# Patient Record
Sex: Female | Born: 1985 | Race: Black or African American | Hispanic: No | Marital: Single | Smoking: Never smoker
Health system: Southern US, Community
[De-identification: ages and names within clinical notes are randomized; demographics above are authoritative.]

## PROBLEM LIST (undated history)

## (undated) DIAGNOSIS — K649 Unspecified hemorrhoids: Secondary | ICD-10-CM

## (undated) DIAGNOSIS — Z8489 Family history of other specified conditions: Secondary | ICD-10-CM

## (undated) DIAGNOSIS — J40 Bronchitis, not specified as acute or chronic: Secondary | ICD-10-CM

## (undated) DIAGNOSIS — R87629 Unspecified abnormal cytological findings in specimens from vagina: Secondary | ICD-10-CM

## (undated) HISTORY — DX: Unspecified abnormal cytological findings in specimens from vagina: R87.629

## (undated) HISTORY — PX: NO PAST SURGERIES: SHX2092

---
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
## 2004-05-28 ENCOUNTER — Emergency Department (HOSPITAL_COMMUNITY): Admission: EM | Admit: 2004-05-28 | Discharge: 2004-05-28 | Payer: Self-pay | Admitting: Emergency Medicine

## 2004-07-13 ENCOUNTER — Inpatient Hospital Stay (HOSPITAL_COMMUNITY): Admission: AD | Admit: 2004-07-13 | Discharge: 2004-07-13 | Payer: Self-pay | Admitting: Obstetrics & Gynecology

## 2005-01-28 ENCOUNTER — Emergency Department (HOSPITAL_COMMUNITY): Admission: EM | Admit: 2005-01-28 | Discharge: 2005-01-28 | Payer: Self-pay | Admitting: Emergency Medicine

## 2005-07-01 ENCOUNTER — Emergency Department (HOSPITAL_COMMUNITY): Admission: EM | Admit: 2005-07-01 | Discharge: 2005-07-01 | Payer: Self-pay | Admitting: Family Medicine

## 2005-09-28 ENCOUNTER — Emergency Department (HOSPITAL_COMMUNITY): Admission: EM | Admit: 2005-09-28 | Discharge: 2005-09-28 | Payer: Self-pay | Admitting: Family Medicine

## 2005-12-03 ENCOUNTER — Emergency Department (HOSPITAL_COMMUNITY): Admission: EM | Admit: 2005-12-03 | Discharge: 2005-12-03 | Payer: Self-pay

## 2011-01-23 ENCOUNTER — Inpatient Hospital Stay (INDEPENDENT_AMBULATORY_CARE_PROVIDER_SITE_OTHER)
Admission: RE | Admit: 2011-01-23 | Discharge: 2011-01-23 | Disposition: A | Discharge status: Home / Self Care | Payer: Self-pay | Source: Ambulatory Visit | Attending: Emergency Medicine | Admitting: Emergency Medicine

## 2011-01-23 DIAGNOSIS — N898 Other specified noninflammatory disorders of vagina: Secondary | ICD-10-CM

## 2011-01-23 DIAGNOSIS — H109 Unspecified conjunctivitis: Secondary | ICD-10-CM

## 2011-01-23 LAB — WET PREP, GENITAL: Yeast Wet Prep HPF POC: NONE SEEN

## 2011-01-25 LAB — GC/CHLAMYDIA PROBE AMP, GENITAL
Chlamydia, DNA Probe: NEGATIVE
GC Probe Amp, Genital: NEGATIVE

## 2011-10-05 ENCOUNTER — Encounter (HOSPITAL_COMMUNITY): Payer: Self-pay | Admitting: Emergency Medicine

## 2011-10-05 ENCOUNTER — Emergency Department (HOSPITAL_COMMUNITY)
Admission: EM | Admit: 2011-10-05 | Discharge: 2011-10-05 | Disposition: A | Discharge status: Home / Self Care | Payer: Self-pay | Attending: Emergency Medicine | Admitting: Emergency Medicine

## 2011-10-05 ENCOUNTER — Emergency Department (HOSPITAL_COMMUNITY): Payer: Self-pay

## 2011-10-05 DIAGNOSIS — R0602 Shortness of breath: Secondary | ICD-10-CM | POA: Insufficient documentation

## 2011-10-05 DIAGNOSIS — R062 Wheezing: Secondary | ICD-10-CM | POA: Insufficient documentation

## 2011-10-05 DIAGNOSIS — J3489 Other specified disorders of nose and nasal sinuses: Secondary | ICD-10-CM | POA: Insufficient documentation

## 2011-10-05 DIAGNOSIS — J40 Bronchitis, not specified as acute or chronic: Secondary | ICD-10-CM | POA: Insufficient documentation

## 2011-10-05 HISTORY — DX: Bronchitis, not specified as acute or chronic: J40

## 2011-10-05 MED ORDER — ALBUTEROL SULFATE (5 MG/ML) 0.5% IN NEBU
2.5000 mg | INHALATION_SOLUTION | Freq: Once | RESPIRATORY_TRACT | Status: AC
  Administered 2011-10-05: 5 mg via RESPIRATORY_TRACT
  Filled 2011-10-05: qty 1

## 2011-10-05 MED ORDER — AEROCHAMBER PLUS W/MASK MISC
Status: AC
  Filled 2011-10-05: qty 1

## 2011-10-05 MED ORDER — ALBUTEROL SULFATE HFA 108 (90 BASE) MCG/ACT IN AERS
2.0000 | INHALATION_SPRAY | RESPIRATORY_TRACT | Status: DC | PRN
  Filled 2011-10-05: qty 6.7

## 2011-10-05 MED ORDER — ALBUTEROL SULFATE (5 MG/ML) 0.5% IN NEBU
5.0000 mg | INHALATION_SOLUTION | Freq: Once | RESPIRATORY_TRACT | Status: AC
  Administered 2011-10-05: 5 mg via RESPIRATORY_TRACT
  Filled 2011-10-05: qty 1

## 2011-10-05 MED ORDER — IPRATROPIUM BROMIDE 0.02 % IN SOLN
0.5000 mg | Freq: Once | RESPIRATORY_TRACT | Status: AC
  Administered 2011-10-05: 0.5 mg via RESPIRATORY_TRACT
  Filled 2011-10-05: qty 2.5

## 2011-10-05 MED ORDER — AEROCHAMBER MAX W/MASK MEDIUM MISC
1.0000 | Freq: Once | Status: AC
  Administered 2011-10-05: 1
  Filled 2011-10-05: qty 1

## 2011-10-05 NOTE — ED Provider Notes (Signed)
History     CSN: [id number]  Arrival date & time 10/05/11  4098   First MD Initiated Contact with Patient 10/05/11 0719      Chief Complaint  Patient presents with  . Shortness of Breath    (Consider location/radiation/quality/duration/timing/severity/associated sxs/prior treatment) HPI Comments: Patient awoke this morning with onset of cough and wheezing. Cough was productive of sputum. She denies fever. She has runny nose, no sore throat. No chest pain or abdominal pain. No history of asthma. No treatments prior to arrival.  Patient is a 26 y.o. female presenting with cough. The history is provided by the patient.  Cough This is a new problem. The current episode started 3 to 5 hours ago. The problem has not changed since onset.The cough is productive of sputum. There has been no fever. Associated symptoms include rhinorrhea and wheezing. Pertinent negatives include no chest pain, no chills, no ear pain, no headaches, no sore throat, no myalgias, no shortness of breath and no eye redness. She has tried nothing for the symptoms. Her past medical history is significant for bronchitis. Her past medical history does not include asthma.    Past Medical History  Diagnosis Date  . Bronchitis     History reviewed. No pertinent past surgical history.  No family history on file.  History  Substance Use Topics  . Smoking status: Never Smoker   . Smokeless tobacco: Not on file  . Alcohol Use: Yes    OB History    Grav Para Term Preterm Abortions TAB SAB Ect Mult Living                  Review of Systems  Constitutional: Negative for fever and chills.  HENT: Positive for rhinorrhea. Negative for ear pain and sore throat.   Eyes: Negative for redness.  Respiratory: Positive for cough and wheezing. Negative for shortness of breath.   Cardiovascular: Negative for chest pain and leg swelling.  Gastrointestinal: Negative for nausea, vomiting, abdominal pain and diarrhea.    Genitourinary: Negative for dysuria.  Musculoskeletal: Negative for myalgias.  Skin: Negative for rash.  Neurological: Negative for headaches.    Allergies  Review of patient's allergies indicates no known allergies.  Home Medications  No current outpatient prescriptions on file.  BP 126/86  Pulse 90  Temp(Src) 98.1 F (36.7 C) (Oral)  Resp 18  SpO2 94%  LMP 09/20/2011  Physical Exam  Nursing note and vitals reviewed. Constitutional: She is oriented to person, place, and time. She appears well-developed and well-nourished.  HENT:  Head: Normocephalic and atraumatic.  Right Ear: Tympanic membrane, external ear and ear canal normal.  Left Ear: Tympanic membrane, external ear and ear canal normal.  Nose: Rhinorrhea present.  Mouth/Throat: Uvula is midline and oropharynx is clear and moist. Mucous membranes are not dry.  Eyes: Conjunctivae are normal. Right eye exhibits no discharge. Left eye exhibits no discharge.  Neck: Normal range of motion. Neck supple.  Cardiovascular: Normal rate, regular rhythm and normal heart sounds.   Pulmonary/Chest: Effort normal. She has no decreased breath sounds. She has wheezes in the right lower field and the left lower field. She has rhonchi (throughout). She has no rales.  Abdominal: Soft. There is no tenderness.  Neurological: She is alert and oriented to person, place, and time.  Skin: Skin is warm and dry.  Psychiatric: She has a normal mood and affect.    ED Course  Procedures (including critical care time)  Labs Reviewed - No data  to display Dg Chest 2 View  10/05/2011  *RADIOLOGY REPORT*  Clinical Data: Shortness of breath.  Cough.  CHEST - 2 VIEW  Comparison: None.  Findings: The heart size is normal.  The lungs are clear.  The visualized soft tissues and bony thorax are unremarkable.  IMPRESSION: Negative chest.  Original Report Authenticated By: Jamesetta Orleans. MATTERN, M.D.     1. Bronchitis     7:38 AM Patient seen and  examined. Work-up initiated. Medications ordered. Felt better initially after breathing treatment, now chest is tight again.   Vital signs reviewed and are as follows: Filed Vitals:   10/05/11 0534  BP: 126/86  Pulse: 90  Temp: 98.1 F (36.7 C)  Resp: 18   X-ray was reviewed by myself and was negative. Patient informed of results. She is resting comfortably. Breathing improved after second breathing treatment. Exam unchanged. She appears well.   Patient counseled on use of albuterol HFA.  Told to use 1-2 puffs q 4 hours as needed for SOB.  MDM  Patient with cough, no fever. Her chest x-ray is negative. She is improved in emergency department after 2 breathing treatments. Will treat as bronchitis. Patient appears well and is stable at time of discharge.        Renne Crigler, [place] 10/05/11 1007

## 2011-10-05 NOTE — ED Provider Notes (Signed)
Medical screening examination/treatment/procedure(s) were performed by non-physician practitioner and as supervising physician I was immediately available for consultation/collaboration.  Doug Sou, MD 10/05/11 [phone]

## 2011-10-05 NOTE — ED Notes (Signed)
PT. REPORTS SOB WITH PRODUCTIVE COUGH AND WHEEZING ONSET THIS MORNING .

## 2011-10-05 NOTE — Discharge Instructions (Signed)
Please read and follow all provided instructions.  Your diagnoses today include:  1. Bronchitis    Tests performed today include:  Chest x-ray - does not show any pneumonia  Vital signs. See below for your results today.   Medications prescribed:    Albuterol inhaler - medication that opens up your airway  Use inhaler as follows: 1-2 puffs with spacer every 4 hours as needed for wheezing, cough, or shortness of breath.   Take any prescribed medications only as directed.  Home care instructions:  Follow any educational materials contained in this packet.  Follow-up instructions: Please follow-up with your primary care provider in the next 3 days for further evaluation of your symptoms and a recheck if you are not feeling better. If you do not have a primary care doctor -- see below for referral information.   Return instructions:   Please return to the Emergency Department if you experience worsening symptoms.  Please return with worsening wheezing, shortness of breath, or difficulty breathing.  Return with persistent fever above 101F.   Please return if you have any other emergent concerns.  Additional Information:  Your vital signs today were: BP 126/86  Pulse 90  Temp(Src) 98.1 F (36.7 C) (Oral)  Resp 18  SpO2 94%  LMP 09/20/2011 If your blood pressure (BP) was elevated above 135/85 this visit, please have this repeated by your doctor within one month. -------------- No Primary Care Doctor Call [facility]  [phone] Other agencies that provide inexpensive medical care    [facility]  [phone]    [facility]  [phone]    [facility]  [phone]    [facility]  [phone]    [facility]  [phone]    [facility]  [phone] -------------- RESOURCE GUIDE:  Dental Problems  Patients with Medicaid: [facility] [phone] [address]                                             [phone] [address] Phone:  [phone]                                                   Phone:  [phone]  If unable to pay or uninsured, contact:  [facility] or [facility]. to become qualified for the adult dental clinic.  Chronic Pain Problems Contact [facility]  [phone] Patients need to be referred by their primary care doctor.  Insufficient Money for Medicine Contact [employer] Way:  call "211" or [facility] [phone].  Psychological Services [facility] Behavior[facility]  [phone] [facility]  [phone] [facility]   [phone] (emergency services [phone])  Substance Abuse Resources [facility]  [phone] [facility] (250)524-8516 [facility] [place] [phone] Floydene Flock [phone] Residential & Outpatient Substance Abuse Program  [phone]  Abuse/Neglect [facility] Child Abuse Hotline (302)422-6432 [facility] Child Abuse Hotline (248)248-1128 (After Hours)  Emergency Shelter [facility] [phone]  Maternity Homes Room at the [place] [facility] [phone])  [phone] [employer] Services [phone]  [facility] Resources  [facility]     [employer]                          [facility] Dept. [address] [place]                       [address]      [address]  Blondell Reveal Phone:  [phone]                                   Phone:  [phone]                 Phone:  [phone]  [facility] Mental [facility] Phone:  [phone]  [facility] Child Abuse Hotline 480 291 [phone] (After Hours)

## 2012-04-16 ENCOUNTER — Emergency Department (INDEPENDENT_AMBULATORY_CARE_PROVIDER_SITE_OTHER)
Admission: EM | Admit: 2012-04-16 | Discharge: 2012-04-16 | Disposition: A | Discharge status: Home / Self Care | Payer: Self-pay | Source: Home / Self Care | Attending: Family Medicine | Admitting: Family Medicine

## 2012-04-16 ENCOUNTER — Encounter (HOSPITAL_COMMUNITY): Payer: Self-pay | Admitting: Emergency Medicine

## 2012-04-16 DIAGNOSIS — B9689 Other specified bacterial agents as the cause of diseases classified elsewhere: Secondary | ICD-10-CM

## 2012-04-16 DIAGNOSIS — N760 Acute vaginitis: Secondary | ICD-10-CM

## 2012-04-16 DIAGNOSIS — A499 Bacterial infection, unspecified: Secondary | ICD-10-CM

## 2012-04-16 LAB — POCT PREGNANCY, URINE: Preg Test, Ur: NEGATIVE

## 2012-04-16 LAB — POCT URINALYSIS DIP (DEVICE)
Glucose, UA: NEGATIVE mg/dL
Hgb urine dipstick: NEGATIVE
Ketones, ur: NEGATIVE mg/dL
Protein, ur: 30 mg/dL — AB
Specific Gravity, Urine: 1.015 (ref 1.005–1.030)
Urobilinogen, UA: >=8 mg/dL (ref 0.0–1.0)

## 2012-04-16 LAB — WET PREP, GENITAL
Trich, Wet Prep: NONE SEEN
Yeast Wet Prep HPF POC: NONE SEEN

## 2012-04-16 MED ORDER — FLUCONAZOLE 150 MG PO TABS: 150.0000 mg | ORAL_TABLET | Freq: Once | ORAL | Status: DC

## 2012-04-16 MED ORDER — METRONIDAZOLE 250 MG PO TABS: 250.0000 mg | ORAL_TABLET | Freq: Three times a day (TID) | ORAL | Status: DC

## 2012-04-16 NOTE — ED Provider Notes (Signed)
History     CSN: [id number]  Arrival date & time 04/16/12  1759   First MD Initiated Contact with Patient 04/16/12 1811      Chief Complaint  Patient presents with  . Vaginal Itching    (Consider location/radiation/quality/duration/timing/severity/associated sxs/prior treatment) Patient is a 26 y.o. female presenting with vaginal discharge. The history is provided by the patient.  Vaginal Discharge This is a new problem. The current episode started more than 1 week ago. The problem has been gradually worsening. Pertinent negatives include no abdominal pain. Treatments tried: otc bv med but no relief.    Past Medical History  Diagnosis Date  . Bronchitis     History reviewed. No pertinent past surgical history.  History reviewed. No pertinent family history.  History  Substance Use Topics  . Smoking status: Never Smoker   . Smokeless tobacco: Not on file  . Alcohol Use: Yes    OB History    Grav Para Term Preterm Abortions TAB SAB Ect Mult Living                  Review of Systems  Constitutional: Negative.   Gastrointestinal: Negative.  Negative for abdominal pain.  Genitourinary: Positive for vaginal discharge. Negative for dysuria, vaginal bleeding, vaginal pain, menstrual problem and pelvic pain.    Allergies  Review of patient's allergies indicates no known allergies.  Home Medications   Current Outpatient Rx  Name Route Sig Dispense Refill  . FLUCONAZOLE 150 MG PO TABS Oral Take 1 tablet (150 mg total) by mouth once. 1 tablet 0  . METRONIDAZOLE 250 MG PO TABS Oral Take 1 tablet (250 mg total) by mouth 3 (three) times daily. 21 tablet 0    BP 124/74  Pulse 72  Temp 98.3 F (36.8 C) (Oral)  Resp 14  SpO2 100%  LMP 03/22/2012  Physical Exam  Nursing note and vitals reviewed. Constitutional: She is oriented to person, place, and time. She appears well-developed and well-nourished.  Abdominal: Soft. Bowel sounds are normal. She exhibits no  distension and no mass. There is no tenderness. There is no rebound and no guarding.  Genitourinary: Uterus normal. There is no rash or tenderness on the right labia. There is no rash or tenderness on the left labia. There is erythema around the vagina. No tenderness or bleeding around the vagina. Vaginal discharge found.  Neurological: She is alert and oriented to person, place, and time.  Skin: Skin is warm and dry.    ED Course  Procedures (including critical care time)  Labs Reviewed  POCT URINALYSIS DIP (DEVICE) - Abnormal; Notable for the following:    Bilirubin Urine SMALL (*)     Protein, ur 30 (*)     All other components within normal limits  POCT PREGNANCY, URINE  GC/CHLAMYDIA PROBE AMP, GENITAL  WET PREP, GENITAL   No results found.   1. Bacterial vaginitis       MDM          Linna Hoff, MD 04/16/12 1927

## 2012-04-16 NOTE — ED Notes (Signed)
Pt c/o vaginal odor and irritation x 1 wk with a thin white discharge. Pt states that she used over the counter bv essentials with no relief of symptoms, it seemed to make it worse.  Pt denies pelvic pain but is experiencing some lower back pain on the left side only.

## 2012-04-17 LAB — GC/CHLAMYDIA PROBE AMP, GENITAL: GC Probe Amp, Genital: NEGATIVE

## 2012-07-31 ENCOUNTER — Emergency Department (INDEPENDENT_AMBULATORY_CARE_PROVIDER_SITE_OTHER)
Admission: EM | Admit: 2012-07-31 | Discharge: 2012-07-31 | Disposition: A | Discharge status: Home / Self Care | Payer: Self-pay | Source: Home / Self Care

## 2012-07-31 ENCOUNTER — Encounter (HOSPITAL_COMMUNITY): Payer: Self-pay | Admitting: Emergency Medicine

## 2012-07-31 ENCOUNTER — Other Ambulatory Visit (HOSPITAL_COMMUNITY)
Admission: RE | Admit: 2012-07-31 | Discharge: 2012-07-31 | Disposition: A | Discharge status: Home / Self Care | Payer: Self-pay | Source: Ambulatory Visit | Attending: Emergency Medicine | Admitting: Emergency Medicine

## 2012-07-31 DIAGNOSIS — B9689 Other specified bacterial agents as the cause of diseases classified elsewhere: Secondary | ICD-10-CM

## 2012-07-31 DIAGNOSIS — N760 Acute vaginitis: Secondary | ICD-10-CM | POA: Insufficient documentation

## 2012-07-31 DIAGNOSIS — Z113 Encounter for screening for infections with a predominantly sexual mode of transmission: Secondary | ICD-10-CM | POA: Insufficient documentation

## 2012-07-31 DIAGNOSIS — A499 Bacterial infection, unspecified: Secondary | ICD-10-CM

## 2012-07-31 LAB — POCT URINALYSIS DIP (DEVICE)
Bilirubin Urine: NEGATIVE
Hgb urine dipstick: NEGATIVE
Leukocytes, UA: NEGATIVE
Nitrite: NEGATIVE
Urobilinogen, UA: 2 mg/dL — ABNORMAL HIGH (ref 0.0–1.0)
pH: 7 (ref 5.0–8.0)

## 2012-07-31 MED ORDER — METRONIDAZOLE 500 MG PO TABS: 500.0000 mg | ORAL_TABLET | Freq: Two times a day (BID) | ORAL | Status: DC

## 2012-07-31 NOTE — ED Provider Notes (Signed)
Medical screening examination/treatment/procedure(s) were performed by resident physician or non-physician practitioner and as supervising physician I was immediately available for consultation/collaboration.   Barkley Bruns MD.    Linna Hoff, MD 07/31/12 [phone]

## 2012-07-31 NOTE — ED Notes (Signed)
Pt is here for vag discharge x3 days Sx include: white discharge, foul odor, lower back pain Denies: dysuria, abd pain, f/v/n/d LMP: 07/31/12 Back pain: 3/10 on pain scale Hx of BV  She is alert w/no signs of acute distress

## 2012-07-31 NOTE — ED Provider Notes (Signed)
History     CSN: [id number]  Arrival date & time 07/31/12  1314   None     Chief Complaint  Patient presents with  . Vaginal Discharge    (Consider location/radiation/quality/duration/timing/severity/associated sxs/prior treatment) HPI Comments: 27 year old female presents with a painless vaginal discharge. She states it has a bad odor. She denies pelvic or abdominal pain denies fever, nausea, vomiting or urinary symptoms. She states she recently started her menses.   Past Medical History  Diagnosis Date  . Bronchitis     History reviewed. No pertinent past surgical history.  No family history on file.  History  Substance Use Topics  . Smoking status: Never Smoker   . Smokeless tobacco: Not on file  . Alcohol Use: Yes    OB History    Grav Para Term Preterm Abortions TAB SAB Ect Mult Living                  Review of Systems  Constitutional: Negative.   Respiratory: Negative.   Cardiovascular: Negative.   Gastrointestinal: Negative.   Genitourinary:       As per history of present illness  Neurological: Negative.   Psychiatric/Behavioral: Negative.     Allergies  Review of patient's allergies indicates no known allergies.  Home Medications   Current Outpatient Rx  Name  Route  Sig  Dispense  Refill  . FLUCONAZOLE 150 MG PO TABS   Oral   Take 1 tablet (150 mg total) by mouth once.   1 tablet   0   . METRONIDAZOLE 500 MG PO TABS   Oral   Take 1 tablet (500 mg total) by mouth 2 (two) times daily. X 7 days   14 tablet   0     BP 127/80  Pulse 74  Temp 98.4 F (36.9 C) (Oral)  Resp 16  SpO2 100%  LMP 07/31/2012  Physical Exam  Nursing note and vitals reviewed. Constitutional: She is oriented to person, place, and time. She appears well-developed and well-nourished. No distress.  HENT:  Head: Normocephalic and atraumatic.  Mouth/Throat: No oropharyngeal exudate.  Eyes: Conjunctivae normal and EOM are normal.  Neck: Normal range of  motion. Neck supple.  Cardiovascular: Normal rate and normal heart sounds.   Pulmonary/Chest: Effort normal and breath sounds normal. No respiratory distress.  Abdominal: Soft. She exhibits no distension and no mass. There is no tenderness. There is no rebound and no guarding.  Genitourinary:       External pelvic palpation is negative for tenderness. Normal external female genitalia Cervix is midline and posterior. The ectocervix is smooth and pink without erythema or lesions. And there is scant amount of both thick mucoid secretions and pale green , thin discharge. Bimanual: Negative for CMT or adnexal tenderness.  Musculoskeletal: Normal range of motion.  Neurological: She is alert and oriented to person, place, and time. No cranial nerve deficit.  Skin: Skin is warm and dry.  Psychiatric: She has a normal mood and affect.    ED Course  Procedures (including critical care time)  Labs Reviewed  POCT URINALYSIS DIP (DEVICE) - Abnormal; Notable for the following:    Urobilinogen, UA 2.0 (*)     All other components within normal limits  POCT PREGNANCY, URINE  CERVICOVAGINAL ANCILLARY ONLY   No results found.   1. BV (bacterial vaginosis)   2. Vaginitis       MDM  Patient with painless vaginal discharge consistent with bacterial vaginosis. Discharged in stable condition  Metronidazole 500 mg twice a day for 7 days Standard swab testing was performed. Results are pending, upon return for any positives will call and treat accordingly.            Hayden Rasmussen, NP 07/31/12 1507

## 2012-10-23 ENCOUNTER — Encounter (HOSPITAL_COMMUNITY): Payer: Self-pay | Admitting: *Deleted

## 2012-10-23 ENCOUNTER — Emergency Department (INDEPENDENT_AMBULATORY_CARE_PROVIDER_SITE_OTHER)
Admission: EM | Admit: 2012-10-23 | Discharge: 2012-10-23 | Disposition: A | Discharge status: Home / Self Care | Payer: Self-pay | Source: Home / Self Care | Attending: Emergency Medicine | Admitting: Emergency Medicine

## 2012-10-23 DIAGNOSIS — L02412 Cutaneous abscess of left axilla: Secondary | ICD-10-CM

## 2012-10-23 DIAGNOSIS — IMO0002 Reserved for concepts with insufficient information to code with codable children: Secondary | ICD-10-CM

## 2012-10-23 MED ORDER — IBUPROFEN 400 MG PO TABS: 400.0000 mg | ORAL_TABLET | Freq: Four times a day (QID) | ORAL | Status: DC | PRN

## 2012-10-23 MED ORDER — DOXYCYCLINE HYCLATE 100 MG PO CAPS: 100.0000 mg | ORAL_CAPSULE | Freq: Two times a day (BID) | ORAL | Status: AC

## 2012-10-23 NOTE — ED Notes (Signed)
C/o boil under L axilla onset 3 days ago.

## 2012-10-23 NOTE — ED Provider Notes (Signed)
History     CSN: [id number]  Arrival date & time 10/23/12  1847   First MD Initiated Contact with Patient 10/23/12 1852      Chief Complaint  Patient presents with  . Abscess    (Consider location/radiation/quality/duration/timing/severity/associated sxs/prior treatment) HPI Comments: Presents this evening complaining of pain swelling and a ball formation in her left axillary region. Denies any trauma or injury. Denies any fevers or chills. She didn't attempt to squeeze it but was unsuccessful  Patient is a 27 y.o. female presenting with abscess. The history is provided by the patient.  Abscess Abscess quality: induration, painful, redness and warmth   Red streaking: no   Duration:  3 days Pain details:    Quality:  Pressure and sharp   Severity:  Mild   Timing:  Constant   Progression:  Worsening Context: not immunosuppression and not skin injury   Relieved by:  Nothing Associated symptoms: no anorexia, no fatigue and no fever   Risk factors: no prior abscess     Past Medical History  Diagnosis Date  . Bronchitis     History reviewed. No pertinent past surgical history.  Family History  Problem Relation Age of Onset  . Hypertension Other   . Diabetes Other   . Asthma Other     History  Substance Use Topics  . Smoking status: Never Smoker   . Smokeless tobacco: Not on file  . Alcohol Use: Yes     Comment: occasional    OB History   Grav Para Term Preterm Abortions TAB SAB Ect Mult Living                  Review of Systems  Constitutional: Positive for activity change. Negative for fever, chills, appetite change and fatigue.  Gastrointestinal: Negative for anorexia.  Musculoskeletal: Negative for joint swelling and arthralgias.  Skin: Positive for color change and rash.    Allergies  Review of patient's allergies indicates no known allergies.  Home Medications   Current Outpatient Rx  Name  Route  Sig  Dispense  Refill  . doxycycline  (VIBRAMYCIN) 100 MG capsule   Oral   Take 1 capsule (100 mg total) by mouth 2 (two) times daily.   20 capsule   0   . ibuprofen (ADVIL,MOTRIN) 400 MG tablet   Oral   Take 1 tablet (400 mg total) by mouth every 6 (six) hours as needed for pain.   30 tablet   0     BP 126/78  Pulse 59  Temp(Src) 99.2 F (37.3 C) (Oral)  Resp 18  SpO2 100%  LMP 10/21/2012  Physical Exam  Nursing note and vitals reviewed. Constitutional: Vital signs are normal. She appears well-developed and well-nourished.  Neck: Neck supple.  Musculoskeletal:       Arms: Neurological: She is alert.  Skin: No rash noted. There is erythema.    ED Course  INCISION AND DRAINAGE Date/Time: 10/23/2012 8:04 PM Performed by: Rebel Willcutt Authorized by: Jimmie Molly Consent: Verbal consent obtained. Risks and benefits: risks, benefits and alternatives were discussed Consent given by: patient Patient understanding: patient states understanding of the procedure being performed Patient identity confirmed: verbally with patient Type: abscess Anesthesia: local infiltration Scalpel size: 11 Incision type: elliptical Complexity: simple Packing material: 1/4 in gauze   (including critical care time)  Labs Reviewed  CULTURE, ROUTINE-ABSCESS   No results found.   1. Abscess of left axilla       MDM  Left  axillary abscess effectively I indeed appear patient was started on doxycycline and asked to return for abscess recheck and drainage removal. Uncomplicated procedure      Jimmie Molly, MD 10/23/12 2006

## 2012-10-25 ENCOUNTER — Emergency Department (INDEPENDENT_AMBULATORY_CARE_PROVIDER_SITE_OTHER)
Admission: EM | Admit: 2012-10-25 | Discharge: 2012-10-25 | Disposition: A | Discharge status: Home / Self Care | Payer: Self-pay | Source: Home / Self Care | Attending: Family Medicine | Admitting: Family Medicine

## 2012-10-25 ENCOUNTER — Encounter (HOSPITAL_COMMUNITY): Payer: Self-pay

## 2012-10-25 DIAGNOSIS — Z4800 Encounter for change or removal of nonsurgical wound dressing: Secondary | ICD-10-CM

## 2012-10-25 NOTE — ED Notes (Signed)
Here for wound check and poss packing removal

## 2012-10-25 NOTE — ED Provider Notes (Signed)
History     CSN: [id number]  Arrival date & time 10/25/12  6295   First MD Initiated Contact with Patient 10/25/12 1859      Chief Complaint  Patient presents with  . Wound Check    (Consider location/radiation/quality/duration/timing/severity/associated sxs/prior treatment) HPI Comments: 27 year old nondiabetic female with a history of left axillary abscess status post incision and drainage today's ago. Denies fever or chills. Reports pain, swelling and redness decrease. No longer draining. She's here for wound check and packing removal. Patient has staph growing in her abscess culture still not sensitivity. She's taking doxycycline without side effects.     Past Medical History  Diagnosis Date  . Bronchitis     History reviewed. No pertinent past surgical history.  Family History  Problem Relation Age of Onset  . Hypertension Mother   . Diabetes Mother   . Asthma Brother     History  Substance Use Topics  . Smoking status: Never Smoker   . Smokeless tobacco: Not on file  . Alcohol Use: Yes     Comment: occasional    OB History   Grav Para Term Preterm Abortions TAB SAB Ect Mult Living                  Review of Systems  Constitutional: Negative for fever and chills.  Gastrointestinal: Negative for nausea and vomiting.  Skin: Positive for wound.       As per history of present illness  All other systems reviewed and are negative.    Allergies  Review of patient's allergies indicates no known allergies.  Home Medications   Current Outpatient Rx  Name  Route  Sig  Dispense  Refill  . doxycycline (VIBRAMYCIN) 100 MG capsule   Oral   Take 1 capsule (100 mg total) by mouth 2 (two) times daily.   20 capsule   0   . ibuprofen (ADVIL,MOTRIN) 400 MG tablet   Oral   Take 1 tablet (400 mg total) by mouth every 6 (six) hours as needed for pain.   30 tablet   0     BP 130/77  Pulse 60  Temp(Src) 98.4 F (36.9 C) (Oral)  Resp 16  SpO2 99%  LMP  10/21/2012  Physical Exam  Vitals reviewed. Constitutional: She is oriented to person, place, and time. She appears well-developed and well-nourished.  Neurological: She is alert and oriented to person, place, and time.  Skin:  Left axilla: Abscess status post incision and drainage with packing. Packing was removed only pink not significant drainage after packing removal. No significant erythema, no induration or fluctuation. Area was not tender.    ED Course  Procedures (including critical care time)  Labs Reviewed - No data to display No results found.   1. Abscess packing removal       MDM  Packing removed. Appears healing well. Continue doxycycline. Wound care instructions provided. Supportive care and red flags that should prompt her return to medical attention discussed with patient and provided in writing.         Sharin Grave, MD 10/25/12 2841

## 2012-10-26 LAB — CULTURE, ROUTINE-ABSCESS

## 2012-10-27 ENCOUNTER — Telehealth (HOSPITAL_COMMUNITY): Payer: Self-pay | Admitting: *Deleted

## 2012-10-27 NOTE — ED Notes (Signed)
Abscess culture L axilla:  Abundant MRSA.  Pt. adequately treated with Doxycycline.  I called pt. But she was unavailable. Unable to leave a message. I called pt.'s Mother and she said she is on the highway coming home. I told her to call me tomorrow between 2-3 P @ [phone]. Vassie Moselle 10/27/2012

## 2012-10-29 ENCOUNTER — Telehealth (HOSPITAL_COMMUNITY): Payer: Self-pay | Admitting: *Deleted

## 2012-10-29 ENCOUNTER — Telehealth (HOSPITAL_COMMUNITY): Payer: Self-pay | Admitting: Family Medicine

## 2012-10-29 NOTE — ED Notes (Signed)
Unable to reach pt. by phone. I called pt.'s contact [name] and left a message for her to call.  Mother said she would give her the message. Cynthia Wong 10/29/2012

## 2012-10-29 NOTE — ED Notes (Signed)
Patient returned lab result phone call today;  Patient made aware of positive MRSA results and she was treated adequately with Doxycyline.  Patient given MRSA specific instructions and had all additional questions answered

## 2012-10-31 ENCOUNTER — Telehealth (HOSPITAL_COMMUNITY): Payer: Self-pay | Admitting: *Deleted

## 2012-10-31 NOTE — ED Notes (Signed)
I called pt. back to make sure she understood the MRSA instructions. Pt.'s questions answered.

## 2012-11-15 ENCOUNTER — Emergency Department (INDEPENDENT_AMBULATORY_CARE_PROVIDER_SITE_OTHER)
Admission: EM | Admit: 2012-11-15 | Discharge: 2012-11-15 | Disposition: A | Discharge status: Home / Self Care | Payer: Self-pay | Source: Home / Self Care | Attending: Family Medicine | Admitting: Family Medicine

## 2012-11-15 ENCOUNTER — Encounter (HOSPITAL_COMMUNITY): Payer: Self-pay | Admitting: Emergency Medicine

## 2012-11-15 DIAGNOSIS — B9689 Other specified bacterial agents as the cause of diseases classified elsewhere: Secondary | ICD-10-CM

## 2012-11-15 DIAGNOSIS — A499 Bacterial infection, unspecified: Secondary | ICD-10-CM

## 2012-11-15 DIAGNOSIS — N760 Acute vaginitis: Secondary | ICD-10-CM

## 2012-11-15 LAB — POCT URINALYSIS DIP (DEVICE)
Bilirubin Urine: NEGATIVE
Glucose, UA: NEGATIVE mg/dL
Ketones, ur: NEGATIVE mg/dL
Leukocytes, UA: NEGATIVE
Protein, ur: 30 mg/dL — AB
Specific Gravity, Urine: 1.02 (ref 1.005–1.030)

## 2012-11-15 LAB — POCT PREGNANCY, URINE: Preg Test, Ur: NEGATIVE

## 2012-11-15 MED ORDER — METRONIDAZOLE 500 MG PO TABS: 500.0000 mg | ORAL_TABLET | Freq: Two times a day (BID) | ORAL | Status: DC

## 2012-11-15 MED ORDER — FLUCONAZOLE 150 MG PO TABS: ORAL_TABLET | ORAL | Status: DC

## 2012-11-15 NOTE — ED Provider Notes (Signed)
History     CSN: [id number]  Arrival date & time 11/15/12  1707   First MD Initiated Contact with Patient 11/15/12 1847      Chief Complaint  Patient presents with  . Vaginal Itching    (Consider location/radiation/quality/duration/timing/severity/associated sxs/prior treatment) HPI Comments: 27 year old female with history of recurrent BV. Here complaining of vaginal itchiness and minimal discharge with foul smell. Patient was treated with antibiotics recently for a skin infection. Denies significant vaginal discharge denies pelvic pain patient is not concern about sexually transmitted diseases. Denies abdominal pain, nausea or vomiting. No fever or chills. No dysuria.   Past Medical History  Diagnosis Date  . Bronchitis     History reviewed. No pertinent past surgical history.  Family History  Problem Relation Age of Onset  . Hypertension Mother   . Diabetes Mother   . Asthma Brother     History  Substance Use Topics  . Smoking status: Never Smoker   . Smokeless tobacco: Not on file  . Alcohol Use: Yes     Comment: occasional    OB History   Grav Para Term Preterm Abortions TAB SAB Ect Mult Living                  Review of Systems  Constitutional: Negative for fever, chills and appetite change.  Gastrointestinal: Negative for nausea, vomiting and abdominal pain.  Genitourinary: Negative for dysuria, frequency, hematuria, flank pain, vaginal bleeding and pelvic pain.  Skin: Negative for rash.  Neurological: Negative for dizziness and headaches.  All other systems reviewed and are negative.    Allergies  Review of patient's allergies indicates no known allergies.  Home Medications   Current Outpatient Rx  Name  Route  Sig  Dispense  Refill  . fluconazole (DIFLUCAN) 150 MG tablet      1 tab po every 72 h x2   2 tablet   0   . ibuprofen (ADVIL,MOTRIN) 400 MG tablet   Oral   Take 1 tablet (400 mg total) by mouth every 6 (six) hours as needed for  pain.   30 tablet   0   . metroNIDAZOLE (FLAGYL) 500 MG tablet   Oral   Take 1 tablet (500 mg total) by mouth 2 (two) times daily.   14 tablet   0     BP 119/74  Pulse 75  Temp(Src) 98.4 F (36.9 C) (Oral)  Resp 16  SpO2 100%  LMP 10/21/2012  Physical Exam  Nursing note and vitals reviewed. Constitutional: She is oriented to person, place, and time. She appears well-developed and well-nourished. No distress.  HENT:  Head: Normocephalic and atraumatic.  Eyes: No scleral icterus.  Cardiovascular: Normal heart sounds.   Pulmonary/Chest: Breath sounds normal.  Abdominal: Soft. She exhibits no distension and no mass. There is no tenderness. There is no rebound and no guarding.  No CVT  Genitourinary:  Pelvic exam deferred  Neurological: She is alert and oriented to person, place, and time.  Skin: No rash noted. She is not diaphoretic.    ED Course  Procedures (including critical care time)  Labs Reviewed  POCT URINALYSIS DIP (DEVICE) - Abnormal; Notable for the following:    Protein, ur 30 (*)    All other components within normal limits  POCT PREGNANCY, URINE   No results found.   1. Vaginitis       MDM  Not associated pelvic pain. Declined STD check. Last STD check in January normal; impress possible candida  related vaginitis. Has risk factor of recent abx. Tx. u-preg negative.  Treated empirically with Flagyl and Diflucan. Supportive care and red flags that should prompt her return to medical attention discussed with patient and provided in writing.        Sharin Grave, MD 11/15/12 2130

## 2012-11-15 NOTE — ED Notes (Signed)
Pt c/o poss BV onset Monday Sx include: odor, irritation [name]: vag d/c, dysuria, hematuria, f/vn/d She is alert and oriented w/no signs of acute distress; hx of BV's

## 2012-11-15 NOTE — ED Notes (Signed)
Vitals obtained by xray student sam 

## 2013-02-01 ENCOUNTER — Emergency Department (HOSPITAL_COMMUNITY)
Admission: EM | Admit: 2013-02-01 | Discharge: 2013-02-01 | Disposition: A | Discharge status: Home / Self Care | Payer: Self-pay | Attending: Emergency Medicine | Admitting: Emergency Medicine

## 2013-02-01 ENCOUNTER — Encounter (HOSPITAL_COMMUNITY): Payer: Self-pay | Admitting: *Deleted

## 2013-02-01 DIAGNOSIS — Z9889 Other specified postprocedural states: Secondary | ICD-10-CM | POA: Insufficient documentation

## 2013-02-01 DIAGNOSIS — R319 Hematuria, unspecified: Secondary | ICD-10-CM | POA: Insufficient documentation

## 2013-02-01 DIAGNOSIS — R109 Unspecified abdominal pain: Secondary | ICD-10-CM | POA: Insufficient documentation

## 2013-02-01 DIAGNOSIS — R350 Frequency of micturition: Secondary | ICD-10-CM | POA: Insufficient documentation

## 2013-02-01 DIAGNOSIS — Z3202 Encounter for pregnancy test, result negative: Secondary | ICD-10-CM | POA: Insufficient documentation

## 2013-02-01 DIAGNOSIS — Z8709 Personal history of other diseases of the respiratory system: Secondary | ICD-10-CM | POA: Insufficient documentation

## 2013-02-01 DIAGNOSIS — Z87440 Personal history of urinary (tract) infections: Secondary | ICD-10-CM | POA: Insufficient documentation

## 2013-02-01 DIAGNOSIS — M549 Dorsalgia, unspecified: Secondary | ICD-10-CM | POA: Insufficient documentation

## 2013-02-01 DIAGNOSIS — R300 Dysuria: Secondary | ICD-10-CM | POA: Insufficient documentation

## 2013-02-01 LAB — POCT I-STAT, CHEM 8
BUN: 8 mg/dL (ref 6–23)
Calcium, Ion: 1.2 mmol/L (ref 1.12–1.23)
HCT: 45 % (ref 36.0–46.0)
TCO2: 23 mmol/L (ref 0–100)

## 2013-02-01 LAB — CBC WITH DIFFERENTIAL/PLATELET
Basophils Absolute: 0.1 10*3/uL (ref 0.0–0.1)
Basophils Relative: 1 % (ref 0–1)
Eosinophils Relative: 5 % (ref 0–5)
HCT: 42.3 % (ref 36.0–46.0)
MCHC: 33.3 g/dL (ref 30.0–36.0)
MCV: 87.8 fL (ref 78.0–100.0)
Monocytes Absolute: 0.5 10*3/uL (ref 0.1–1.0)
RDW: 13 % (ref 11.5–15.5)

## 2013-02-01 LAB — URINALYSIS, ROUTINE W REFLEX MICROSCOPIC
Bilirubin Urine: NEGATIVE
Hgb urine dipstick: NEGATIVE
Protein, ur: NEGATIVE mg/dL
Urobilinogen, UA: 0.2 mg/dL (ref 0.0–1.0)

## 2013-02-01 MED ORDER — PHENAZOPYRIDINE HCL 200 MG PO TABS
200.0000 mg | ORAL_TABLET | Freq: Three times a day (TID) | ORAL | Status: DC
Start: 2013-02-01 — End: 2013-02-01
  Administered 2013-02-01: 200 mg via ORAL
  Filled 2013-02-01: qty 1

## 2013-02-01 MED ORDER — NAPROXEN 500 MG PO TABS: 500.0000 mg | ORAL_TABLET | Freq: Two times a day (BID) | ORAL | Status: DC

## 2013-02-01 NOTE — ED Provider Notes (Signed)
CSN: [id number]     Arrival date & time 02/01/13  0422 History     First MD Initiated Contact with Patient 02/01/13 [phone]     Chief Complaint  Patient presents with  . Flank Pain   HPI  History provided by the patient. Patient is a 27 year old female with past history of urinary tract infections who presents with complaints of persistent left flank pains and discomfort. Patient reports first having some dysuria with urinary frequency and left flank pain that began 4 days ago. Patient called in over the phone physician who called in a prescription for amoxicillin given her symptoms similar to previous UTIs. Patient began taking this and states her dysuria and frequency have improved. She has continued to notice some small amounts of hematuria. She has also continued to have left flank and back pain. Pain radiates around somewhat to the left abdomen area. Patient also states that she had an elective D&C abortion 3-1/2 weeks ago. Patient did have vaginal bleeding following this that improved. She has not noticed any significant vaginal discharge or bleeding since that time. Denies any vaginal pain. No other aggravating or alleviating factors. No associated fever, chills or sweats. No nausea or vomiting.    Past Medical History  Diagnosis Date  . Bronchitis    History reviewed. No pertinent past surgical history. Family History  Problem Relation Age of Onset  . Hypertension Mother   . Diabetes Mother   . Asthma Brother    History  Substance Use Topics  . Smoking status: Never Smoker   . Smokeless tobacco: Not on file  . Alcohol Use: Yes     Comment: occasional   OB History   Grav Para Term Preterm Abortions TAB SAB Ect Mult Living                 Review of Systems  Constitutional: Negative for fever, chills and diaphoresis.  Respiratory: Negative for shortness of breath.   Cardiovascular: Negative for chest pain.  Gastrointestinal: Positive for abdominal pain. Negative for  nausea, vomiting, diarrhea and constipation.  Genitourinary: Positive for flank pain. Negative for dysuria, frequency, hematuria, vaginal bleeding and vaginal discharge.  Musculoskeletal: Positive for back pain.  All other systems reviewed and are negative.    Allergies  Review of patient's allergies indicates no known allergies.  Home Medications   Current Outpatient Rx  Name  Route  Sig  Dispense  Refill  . amoxicillin (AMOXIL) 500 MG capsule   Oral   Take 500 mg by mouth 3 (three) times daily.         Marland Kitchen ibuprofen (ADVIL,MOTRIN) 600 MG tablet   Oral   Take 300 mg by mouth every 6 (six) hours as needed for pain.         Suzzanne Cloud Estradiol (SPRINTEC 28 PO)   Oral   Take 1 tablet by mouth daily.          BP 132/95  Pulse 76  Temp(Src) 98.3 F (36.8 C) (Oral)  Resp 16  SpO2 100%  LMP 11/20/2012 Physical Exam  Nursing note and vitals reviewed. Constitutional: She is oriented to person, place, and time. She appears well-developed and well-nourished. No distress.  HENT:  Head: Normocephalic.  Cardiovascular: Normal rate and regular rhythm.   Pulmonary/Chest: Effort normal and breath sounds normal. No respiratory distress. She has no wheezes. She has no rales.  Abdominal: Soft. She exhibits no distension. There is no tenderness. There is no rebound and no guarding.  Very mild left CVA tenderness  Musculoskeletal: Normal range of motion.  Neurological: She is alert and oriented to person, place, and time.  Skin: Skin is warm and dry. No rash noted.  Psychiatric: She has a normal mood and affect. Her behavior is normal.    ED Course   Procedures   Results for orders placed during the hospital encounter of 02/01/13  URINALYSIS, ROUTINE W REFLEX MICROSCOPIC      Result Value Range   Color, Urine YELLOW  YELLOW   APPearance CLEAR  CLEAR   Specific Gravity, Urine 1.014  1.005 - 1.030   pH 6.0  5.0 - 8.0   Glucose, UA NEGATIVE  NEGATIVE mg/dL   Hgb  urine dipstick NEGATIVE  NEGATIVE   Bilirubin Urine NEGATIVE  NEGATIVE   Ketones, ur NEGATIVE  NEGATIVE mg/dL   Protein, ur NEGATIVE  NEGATIVE mg/dL   Urobilinogen, UA 0.2  0.0 - 1.0 mg/dL   Nitrite NEGATIVE  NEGATIVE   Leukocytes, UA NEGATIVE  NEGATIVE  CBC WITH DIFFERENTIAL      Result Value Range   WBC 5.7  4.0 - 10.5 K/uL   RBC 4.82  3.87 - 5.11 MIL/uL   Hemoglobin 14.1  12.0 - 15.0 g/dL   HCT 16.1  09.6 - 04.5 %   MCV 87.8  78.0 - 100.0 fL   MCH 29.3  26.0 - 34.0 pg   MCHC 33.3  30.0 - 36.0 g/dL   RDW 40.9  81.1 - 91.4 %   Platelets 290  150 - 400 K/uL   Neutrophils Relative % 51  43 - 77 %   Neutro Abs 2.9  1.7 - 7.7 K/uL   Lymphocytes Relative 35  12 - 46 %   Lymphs Abs 2.0  0.7 - 4.0 K/uL   Monocytes Relative 8  3 - 12 %   Monocytes Absolute 0.5  0.1 - 1.0 K/uL   Eosinophils Relative 5  0 - 5 %   Eosinophils Absolute 0.3  0.0 - 0.7 K/uL   Basophils Relative 1  0 - 1 %   Basophils Absolute 0.1  0.0 - 0.1 K/uL  POCT PREGNANCY, URINE      Result Value Range   Preg Test, Ur NEGATIVE  NEGATIVE  POCT I-STAT, CHEM 8      Result Value Range   Sodium 140  135 - 145 mEq/L   Potassium 3.8  3.5 - 5.1 mEq/L   Chloride 106  96 - 112 mEq/L   BUN 8  6 - 23 mg/dL   Creatinine, Ser 7.82  0.50 - 1.10 mg/dL   Glucose, Bld 79  70 - 99 mg/dL   Calcium, Ion 9.56  1.12 - 1.23 mmol/L   TCO2 23  0 - 100 mmol/L   Hemoglobin 15.3 (*) 12.0 - 15.0 g/dL   HCT 21.3  08.6 - 57.8 %     1. Flank pain     MDM  4:40 AM patient seen and evaluated. Patient appears well in no acute distress. She does not appear significantly uncomfortable.  Angus Seller, PA-C 02/01/13 [phone]

## 2013-02-01 NOTE — ED Notes (Signed)
Pt c/o left flank and lower back pain radiating to lower abd since Saturday; prescribed meds for UTI on Sunday (amoxicillin)--states dysuria and hematuria is better but continues with abd and flank pain; abortion 25 days ago; no fever

## 2013-02-01 NOTE — ED Provider Notes (Signed)
Medical screening examination/treatment/procedure(s) were performed by non-physician practitioner and as supervising physician I was immediately available for consultation/collaboration.  Sunnie Nielsen, MD 02/01/13 [phone]

## 2013-02-16 ENCOUNTER — Emergency Department (HOSPITAL_COMMUNITY)
Admission: EM | Admit: 2013-02-16 | Discharge: 2013-02-16 | Disposition: A | Discharge status: Home / Self Care | Payer: No Typology Code available for payment source | Attending: Emergency Medicine | Admitting: Emergency Medicine

## 2013-02-16 ENCOUNTER — Encounter (HOSPITAL_COMMUNITY): Payer: Self-pay | Admitting: *Deleted

## 2013-02-16 DIAGNOSIS — Z792 Long term (current) use of antibiotics: Secondary | ICD-10-CM | POA: Insufficient documentation

## 2013-02-16 DIAGNOSIS — S239XXA Sprain of unspecified parts of thorax, initial encounter: Secondary | ICD-10-CM | POA: Insufficient documentation

## 2013-02-16 DIAGNOSIS — Z79899 Other long term (current) drug therapy: Secondary | ICD-10-CM | POA: Insufficient documentation

## 2013-02-16 DIAGNOSIS — Y9389 Activity, other specified: Secondary | ICD-10-CM | POA: Insufficient documentation

## 2013-02-16 DIAGNOSIS — S0990XA Unspecified injury of head, initial encounter: Secondary | ICD-10-CM | POA: Insufficient documentation

## 2013-02-16 DIAGNOSIS — S29019A Strain of muscle and tendon of unspecified wall of thorax, initial encounter: Secondary | ICD-10-CM

## 2013-02-16 DIAGNOSIS — S139XXA Sprain of joints and ligaments of unspecified parts of neck, initial encounter: Secondary | ICD-10-CM | POA: Insufficient documentation

## 2013-02-16 DIAGNOSIS — Z791 Long term (current) use of non-steroidal anti-inflammatories (NSAID): Secondary | ICD-10-CM | POA: Insufficient documentation

## 2013-02-16 DIAGNOSIS — Y92410 Unspecified street and highway as the place of occurrence of the external cause: Secondary | ICD-10-CM | POA: Insufficient documentation

## 2013-02-16 DIAGNOSIS — J209 Acute bronchitis, unspecified: Secondary | ICD-10-CM | POA: Insufficient documentation

## 2013-02-16 DIAGNOSIS — S161XXA Strain of muscle, fascia and tendon at neck level, initial encounter: Secondary | ICD-10-CM

## 2013-02-16 MED ORDER — CYCLOBENZAPRINE HCL 10 MG PO TABS: 10.0000 mg | ORAL_TABLET | Freq: Two times a day (BID) | ORAL | Status: DC | PRN

## 2013-02-16 MED ORDER — NAPROXEN 500 MG PO TABS: 500.0000 mg | ORAL_TABLET | Freq: Two times a day (BID) | ORAL | Status: DC

## 2013-02-16 MED ORDER — KETOROLAC TROMETHAMINE 60 MG/2ML IM SOLN
60.0000 mg | Freq: Once | INTRAMUSCULAR | Status: AC
  Administered 2013-02-16: 60 mg via INTRAMUSCULAR
  Filled 2013-02-16: qty 2
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
## 2018-01-16 DIAGNOSIS — T3695XA Adverse effect of unspecified systemic antibiotic, initial encounter: Secondary | ICD-10-CM | POA: Diagnosis not present

## 2018-01-16 DIAGNOSIS — N898 Other specified noninflammatory disorders of vagina: Secondary | ICD-10-CM | POA: Diagnosis not present

## 2018-01-16 DIAGNOSIS — B379 Candidiasis, unspecified: Secondary | ICD-10-CM | POA: Diagnosis not present

## 2018-02-06 DIAGNOSIS — N898 Other specified noninflammatory disorders of vagina: Secondary | ICD-10-CM | POA: Diagnosis not present

## 2018-08-28 DIAGNOSIS — R1031 Right lower quadrant pain: Secondary | ICD-10-CM | POA: Diagnosis not present

## 2018-08-28 DIAGNOSIS — K529 Noninfective gastroenteritis and colitis, unspecified: Secondary | ICD-10-CM | POA: Diagnosis not present

## 2018-08-28 DIAGNOSIS — R112 Nausea with vomiting, unspecified: Secondary | ICD-10-CM | POA: Diagnosis not present

## 2019-01-26 DIAGNOSIS — Z3201 Encounter for pregnancy test, result positive: Secondary | ICD-10-CM | POA: Diagnosis not present

## 2019-02-06 ENCOUNTER — Other Ambulatory Visit: Payer: Self-pay

## 2019-02-06 ENCOUNTER — Emergency Department (HOSPITAL_BASED_OUTPATIENT_CLINIC_OR_DEPARTMENT_OTHER): Payer: Medicaid Other

## 2019-02-06 ENCOUNTER — Encounter (HOSPITAL_BASED_OUTPATIENT_CLINIC_OR_DEPARTMENT_OTHER): Payer: Self-pay

## 2019-02-06 ENCOUNTER — Emergency Department (HOSPITAL_BASED_OUTPATIENT_CLINIC_OR_DEPARTMENT_OTHER)
Admission: EM | Admit: 2019-02-06 | Discharge: 2019-02-06 | Disposition: A | Discharge status: Home / Self Care | Payer: Medicaid Other | Attending: Emergency Medicine | Admitting: Emergency Medicine

## 2019-02-06 DIAGNOSIS — R102 Pelvic and perineal pain: Secondary | ICD-10-CM | POA: Diagnosis present

## 2019-02-06 DIAGNOSIS — O200 Threatened abortion: Secondary | ICD-10-CM | POA: Diagnosis not present

## 2019-02-06 DIAGNOSIS — O208 Other hemorrhage in early pregnancy: Secondary | ICD-10-CM | POA: Diagnosis not present

## 2019-02-06 DIAGNOSIS — Z3A01 Less than 8 weeks gestation of pregnancy: Secondary | ICD-10-CM | POA: Diagnosis not present

## 2019-02-06 DIAGNOSIS — Z3A08 8 weeks gestation of pregnancy: Secondary | ICD-10-CM | POA: Diagnosis not present

## 2019-02-06 LAB — URINALYSIS, MICROSCOPIC (REFLEX): WBC, UA: NONE SEEN WBC/hpf (ref 0–5)

## 2019-02-06 LAB — WET PREP, GENITAL
Sperm: NONE SEEN
Trich, Wet Prep: NONE SEEN
Yeast Wet Prep HPF POC: NONE SEEN

## 2019-02-06 LAB — HCG, QUANTITATIVE, PREGNANCY: hCG, Beta Chain, Quant, S: 224394 m[IU]/mL — ABNORMAL HIGH (ref <5)

## 2019-02-06 LAB — URINALYSIS, ROUTINE W REFLEX MICROSCOPIC
Bilirubin Urine: NEGATIVE
Glucose, UA: NEGATIVE mg/dL
Ketones, ur: 40 mg/dL — AB
Leukocytes,Ua: NEGATIVE
Nitrite: NEGATIVE
Protein, ur: NEGATIVE mg/dL
Specific Gravity, Urine: >1.03 — ABNORMAL HIGH (ref 1.005–1.030)
pH: 6 (ref 5.0–8.0)

## 2019-02-06 LAB — ABO/RH: ABO/RH(D): O POS

## 2019-02-06 LAB — PREGNANCY, URINE: Preg Test, Ur: POSITIVE — AB

## 2019-02-06 MED ORDER — METRONIDAZOLE 500 MG PO TABS: 500.0000 mg | ORAL_TABLET | Freq: Two times a day (BID) | ORAL | 0 refills | Status: DC

## 2019-02-06 NOTE — ED Notes (Signed)
ED Provider at bedside. 

## 2019-02-06 NOTE — ED Triage Notes (Signed)
Pt states she is ~[redacted] weeks pregnant-pos test at [facility] spotting "with tissue" x today-NAD-steady gait

## 2019-02-06 NOTE — ED Provider Notes (Signed)
[facility] EMERGENCY DEPARTMENT Provider Note   CSN: [id number] Arrival date & time: 02/06/19  2039    History   Chief Complaint Chief Complaint  Patient presents with   Vaginal Bleeding    HPI Cynthia Wong is a 33 y.o. female.     The history is provided by the patient and medical records. No language interpreter was used.  Vaginal Bleeding  Cynthia Wong is a 33 y.o. female who presents to the Emergency Department complaining of vaginal bleeding. She is a G6 P2031 that presents the emergency department today for vessel bleeding that began this morning. It is described as light spotting. She has mild suprapubic cramping. LMP was June 15. She has had confirmation of her pregnancy of [facility] but no additional prenatal care. She denies any fevers, nausea, vomiting. Symptoms are mild to moderate in nature. Past Medical History:  Diagnosis Date   Bronchitis     There are no active problems to display for this patient.   History reviewed. No pertinent surgical history.   OB History    Gravida  7   Para  1   Term  1   Preterm      AB  2   Living        SAB  2   TAB      Ectopic      Multiple      Live Births  1            Home Medications    Prior to Admission medications   Medication Sig Start Date End Date Taking? Authorizing Provider  amoxicillin (AMOXIL) 500 MG capsule Take 500 mg by mouth 3 (three) times daily.    [provider]  cyclobenzaprine (FLEXERIL) 10 MG tablet Take 1 tablet (10 mg total) by mouth 2 (two) times daily as needed for muscle spasms. 02/16/13   Eber HongMiller, Brian, MD  ibuprofen (ADVIL,MOTRIN) 600 MG tablet Take 300 mg by mouth every 6 (six) hours as needed for pain.    [provider]  metroNIDAZOLE (FLAGYL) 500 MG tablet Take 1 tablet (500 mg total) by mouth 2 (two) times daily. 02/06/19   Tilden Fossaees, Lida Berkery, MD  naproxen (NAPROSYN) 500 MG tablet Take 1 tablet (500 mg total) by mouth  2 (two) times daily. 02/01/13   Ivonne Andrewammen, Peter, PA-C  naproxen (NAPROSYN) 500 MG tablet Take 1 tablet (500 mg total) by mouth 2 (two) times daily with a meal. 02/16/13   Eber HongMiller, Brian, MD  Norgestimate-Eth Estradiol (SPRINTEC 28 PO) Take 1 tablet by mouth daily.    [provider]    Family History Family History  Problem Relation Age of Onset   Hypertension Mother    Diabetes Mother    Asthma Brother     Social History Social History   Tobacco Use   Smoking status: Never Smoker   Smokeless tobacco: Never Used  Substance Use Topics   Alcohol use: Not Currently   Drug use: No     Allergies   Patient has no known allergies.   Review of Systems Review of Systems  Genitourinary: Positive for vaginal bleeding.  All other systems reviewed and are negative.    Physical Exam Updated Vital Signs BP 138/82 (BP Location: Left Arm)    Pulse 94    Temp 99.1 F (37.3 C) (Oral)    Resp 20    Ht 5\' 4"  (1.626 m)    Wt 70.3 kg    LMP  12/18/2018    SpO2 100%    BMI 26.61 kg/m   Physical Exam Vitals signs and nursing note reviewed.  Constitutional:      Appearance: She is well-developed.  HENT:     Head: Normocephalic and atraumatic.  Cardiovascular:     Rate and Rhythm: Normal rate and regular rhythm.  Pulmonary:     Effort: Pulmonary effort is normal. No respiratory distress.  Abdominal:     Palpations: Abdomen is soft.     Tenderness: There is no abdominal tenderness. There is no guarding or rebound.  Genitourinary:    Comments: Scant mucoid and brown vaginal discharge. No CMT. Musculoskeletal:        General: No swelling or tenderness.  Skin:    General: Skin is warm and dry.  Neurological:     Mental Status: She is alert and oriented to person, place, and time.  Psychiatric:        Behavior: Behavior normal.     ED Treatments / Results  Labs (all labs ordered are listed, but only abnormal results are displayed) Labs Reviewed  WET PREP, GENITAL -  Abnormal; Notable for the following components:      Result Value   Clue Cells Wet Prep HPF POC PRESENT (*)    WBC, Wet Prep HPF POC MODERATE (*)    All other components within normal limits  URINALYSIS, ROUTINE W REFLEX MICROSCOPIC - Abnormal; Notable for the following components:   Specific Gravity, Urine >1.030 (*)    Hgb urine dipstick TRACE (*)    Ketones, ur 40 (*)    All other components within normal limits  PREGNANCY, URINE - Abnormal; Notable for the following components:   Preg Test, Ur POSITIVE (*)    All other components within normal limits  HCG, QUANTITATIVE, PREGNANCY - Abnormal; Notable for the following components:   hCG, Beta Chain, Quant, S 224,394 (*)    All other components within normal limits  URINALYSIS, MICROSCOPIC (REFLEX) - Abnormal; Notable for the following components:   Bacteria, UA RARE (*)    All other components within normal limits  URINE CULTURE  HIV ANTIBODY (ROUTINE TESTING W REFLEX)  RPR  ABO/RH  GC/CHLAMYDIA PROBE AMP ([place]    EKG None  Radiology [place] Ob Comp < 14 Wks  Result Date: 02/06/2019 CLINICAL DATA:  Vaginal bleeding EXAM: OBSTETRIC <14 WK ULTRASOUND TECHNIQUE: Transabdominal ultrasound was performed for evaluation of the gestation as well as the maternal uterus and adnexal regions. COMPARISON:  None. FINDINGS: Intrauterine gestational sac: Single Yolk sac:  Visualized Embryo:  Visualized Cardiac Activity: Visualized Heart Rate: 160 bpm CRL: 13.3 mm   7 w 4 d                  US EDC: 09/21/2019 Subchorionic hemorrhage:  None visualized. Maternal uterus/adnexae: Bilateral ovaries are within normal limits. Left ovary measures 3.9 x 2 by 2.5 cm. The right ovary measures 3.7 x 1.9 x 1.9 cm. No significant free fluid IMPRESSION: 1. Single viable intrauterine pregnancy as above. 2. No specific abnormality is seen. Electronically Signed   By: Jasmine PangKim  Fujinaga M.D.   On: 02/06/2019 21:58    Procedures Procedures (including  critical care time)  Medications Ordered in ED Medications - No data to display   Initial Impression / Assessment and Plan / ED Course  I have reviewed the triage vital signs and the nursing notes.  Pertinent labs & imaging results that were available during my  care of the patient were reviewed by me and considered in my medical decision making (see chart for details).       Patient here for evaluation of vaginal bleeding. She has no significant bleeding on the pelvic examination. Ultrasound demonstrates IUP at seven weeks and four days. All consistent with UTI, patient asymptomatic Wilson cultures. Blood prep with clue cells present, patient without current symptoms, will provide prescription for flagyl if she developed symptomatic discharge she can take. Discussed importance of establishing an OB/GYN. Discussed oral fluid hydration. Discussed starting prenatal vitamins. I discussed home care for bashful pleading, threat miscarriage. Discussed outpatient follow-up and return precautions.  Final Clinical Impressions(s) / ED Diagnoses   Final diagnoses:  Threatened miscarriage    ED Discharge Orders         Ordered    metroNIDAZOLE (FLAGYL) 500 MG tablet  2 times daily     02/06/19 2329           Quintella Reichert, MD 02/07/19 0000

## 2019-02-06 NOTE — ED Notes (Signed)
Patient transported to Ultrasound 

## 2019-02-06 NOTE — Discharge Instructions (Addendum)
You had an ultrasound performed in the Emergency Department today that demonstrates a pregnancy that is 7 weeks and 4 days.  Please follow up with your OBGYN for further evaluation.  Start taking a prenatal vitamin available over the counter.

## 2019-02-08 LAB — URINE CULTURE: Culture: <10000 — AB

## 2019-02-08 LAB — RPR: RPR Ser Ql: NONREACTIVE

## 2019-02-08 LAB — HIV ANTIBODY (ROUTINE TESTING W REFLEX): HIV Screen 4th Generation wRfx: NONREACTIVE

## 2019-02-08 LAB — GC/CHLAMYDIA PROBE AMP (~~LOC~~) NOT AT ARMC
Chlamydia: NEGATIVE
Neisseria Gonorrhea: NEGATIVE

## 2019-03-08 ENCOUNTER — Encounter: Payer: Self-pay | Admitting: Family Medicine

## 2019-03-08 ENCOUNTER — Other Ambulatory Visit (HOSPITAL_COMMUNITY)
Admission: RE | Admit: 2019-03-08 | Discharge: 2019-03-08 | Disposition: A | Discharge status: Home / Self Care | Payer: Medicaid Other | Source: Ambulatory Visit | Attending: Family Medicine | Admitting: Family Medicine

## 2019-03-08 ENCOUNTER — Ambulatory Visit (INDEPENDENT_AMBULATORY_CARE_PROVIDER_SITE_OTHER): Payer: Medicaid Other | Admitting: Family Medicine

## 2019-03-08 ENCOUNTER — Other Ambulatory Visit: Payer: Self-pay

## 2019-03-08 DIAGNOSIS — O0990 Supervision of high risk pregnancy, unspecified, unspecified trimester: Secondary | ICD-10-CM | POA: Insufficient documentation

## 2019-03-08 DIAGNOSIS — Z3A11 11 weeks gestation of pregnancy: Secondary | ICD-10-CM

## 2019-03-08 DIAGNOSIS — O0991 Supervision of high risk pregnancy, unspecified, first trimester: Secondary | ICD-10-CM

## 2019-03-08 DIAGNOSIS — O09299 Supervision of pregnancy with other poor reproductive or obstetric history, unspecified trimester: Secondary | ICD-10-CM | POA: Diagnosis not present

## 2019-03-08 DIAGNOSIS — O09291 Supervision of pregnancy with other poor reproductive or obstetric history, first trimester: Secondary | ICD-10-CM

## 2019-03-08 MED ORDER — DOXYLAMINE-PYRIDOXINE 10-10 MG PO TBEC: 2.0000 | DELAYED_RELEASE_TABLET | Freq: Every day | ORAL | 5 refills | Status: DC

## 2019-03-08 MED ORDER — FAMOTIDINE 20 MG PO TABS: 20.0000 mg | ORAL_TABLET | Freq: Two times a day (BID) | ORAL | 3 refills | Status: DC

## 2019-03-08 NOTE — Progress Notes (Signed)
Pap Patient complaining of spotting and cramping. Patient complaining of persisent cough. Kathrene Alu RN  Bedside ultrasound performed to obtain fetal heart rate of 160 bpm. Fetus is active and moving on ultrasound. Patient had official dating ultrasound done in Emergency Dept on 02-06-2019.  At that visit patient was determined to be 7 weeks and 4 days which is consisent with LMP of 12-18-2018. EDD: 09-24-19. Kathrene Alu RN

## 2019-03-08 NOTE — Progress Notes (Signed)
Subjective:  Cynthia Wong is a [id number] 5484w3d being seen today for her first obstetrical visit.  Her obstetrical history is significant for previous pregnancy with IUFD at 28 weeks. Had induction at 37 weeks with last pregnancy. Patient does intend to breast feed. Pregnancy history fully reviewed.  Patient reports no complaints.  BP 128/86   Pulse 90   Wt 153 lb (69.4 kg)   LMP 12/18/2018   BMI 26.26 kg/m   HISTORY: OB History  Gravida Para Term Preterm AB Living  6 2 1 1 3 1   SAB TAB Ectopic Multiple Live Births  1 2 0 0 1    # Outcome Date GA Lbr Len/2nd Weight Sex Delivery Anes PTL Lv  6 Current           5 TAB 2015          4 TAB 2014             Birth Comments: D&E  3 SAB 2012          2 Term 02/11/10 1267w0d   M Vag-Spont     1 Preterm 2006 5136w0d    Vag-Spont   FD    Past Medical History:  Diagnosis Date  . Bronchitis   . Vaginal Pap smear, abnormal    colposcopy    History reviewed. No pertinent surgical history.  Family History  Problem Relation Age of Onset  . Hypertension Mother   . Diabetes Mother   . Asthma Brother   . Cancer Maternal Grandmother        pancreas  . Cancer Paternal Grandmother      Exam  BP 128/86   Pulse 90   Wt 153 lb (69.4 kg)   LMP 12/18/2018   BMI 26.26 kg/m   CONSTITUTIONAL: Well-developed, well-nourished female in no acute distress.  HENT:  Normocephalic, atraumatic, External right and left ear normal. Oropharynx is clear and moist EYES: Conjunctivae and EOM are normal. Pupils are equal, round, and reactive to light. No scleral icterus.  NECK: Normal range of motion, supple, no masses.  Normal thyroid.  CARDIOVASCULAR: Normal heart rate noted, regular rhythm RESPIRATORY: Clear to auscultation bilaterally. Effort and breath sounds normal, no problems with respiration noted. BREASTS: Symmetric in size. No masses, skin changes, nipple drainage, or lymphadenopathy. ABDOMEN: Soft, normal bowel sounds, no distention  noted.  No tenderness, rebound or guarding.  PELVIC: Normal appearing external genitalia; normal appearing vaginal mucosa and cervix. No abnormal discharge noted. Normal uterine size, no other palpable masses, no uterine or adnexal tenderness. MUSCULOSKELETAL: Normal range of motion. No tenderness.  No cyanosis, clubbing, or edema.  2+ distal pulses. SKIN: Skin is warm and dry. No rash noted. Not diaphoretic. No erythema. No pallor. NEUROLOGIC: Alert and oriented to person, place, and time. Normal reflexes, muscle tone coordination. No cranial nerve deficit noted. PSYCHIATRIC: Normal mood and affect. Normal behavior. Normal judgment and thought content.    Assessment:    Pregnancy: [id number] Patient Active Problem List   Diagnosis Date Noted  . Supervision of high risk pregnancy, antepartum 03/08/2019  . History of stillbirth in currently pregnant patient, unspecified trimester 03/08/2019      Plan:   1. Supervision of high risk pregnancy, antepartum FHT and FH normal. Has slight cough - ? Acid reflux. Start pepcid BID. DIscussed practice - midwives, fellows, call system. Desires panorama. - Enroll Patient in Babyscripts - Genetic Screening - Obstetric Panel, Including HIV - Cytology - PAP( [place]) - [place]  MFM OB DETAIL +14 WK; Future  2. History of stillbirth in currently pregnant patient, unspecified trimester Serial [place] Antenatal testing - Genetic Screening - Obstetric Panel, Including HIV - [place] MFM OB DETAIL +14 WK; Future     Problem list reviewed and updated. 75% of 30 min visit spent on counseling and coordination of care.     Truett Mainland 03/08/2019

## 2019-03-09 ENCOUNTER — Other Ambulatory Visit: Payer: Self-pay

## 2019-03-09 DIAGNOSIS — Z3480 Encounter for supervision of other normal pregnancy, unspecified trimester: Secondary | ICD-10-CM

## 2019-03-09 LAB — OBSTETRIC PANEL, INCLUDING HIV
Antibody Screen: NEGATIVE
Basophils Absolute: 0.1 10*3/uL (ref 0.0–0.2)
Basos: 1 %
EOS (ABSOLUTE): 0.1 10*3/uL (ref 0.0–0.4)
Eos: 2 %
HIV Screen 4th Generation wRfx: NONREACTIVE
Hematocrit: 40.6 % (ref 34.0–46.6)
Hemoglobin: 13.3 g/dL (ref 11.1–15.9)
Hepatitis B Surface Ag: NEGATIVE
Immature Grans (Abs): 0 10*3/uL (ref 0.0–0.1)
Immature Granulocytes: 0 %
Lymphocytes Absolute: 2.3 10*3/uL (ref 0.7–3.1)
Lymphs: 32 %
MCH: 28.7 pg (ref 26.6–33.0)
MCHC: 32.8 g/dL (ref 31.5–35.7)
MCV: 88 fL (ref 79–97)
Monocytes Absolute: 0.4 10*3/uL (ref 0.1–0.9)
Monocytes: 6 %
Neutrophils Absolute: 4.2 10*3/uL (ref 1.4–7.0)
Neutrophils: 59 %
Platelets: 275 10*3/uL (ref 150–450)
RBC: 4.63 x10E6/uL (ref 3.77–5.28)
RDW: 12.7 % (ref 11.7–15.4)
RPR Ser Ql: NONREACTIVE
Rh Factor: POSITIVE
Rubella Antibodies, IGG: 5.67 index (ref >0.99)
WBC: 7.1 10*3/uL (ref 3.4–10.8)

## 2019-03-09 MED ORDER — AMBULATORY NON FORMULARY MEDICATION: 1.0000 | 0 refills | Status: DC

## 2019-03-09 NOTE — Progress Notes (Signed)
BP cuff Rx faxed to [facility] on 03/09/19. Jeffry Vogelsang l Keniyah Gelinas, CMA

## 2019-03-14 LAB — CYTOLOGY - PAP
Chlamydia: NEGATIVE
Diagnosis: NEGATIVE
HPV: NOT DETECTED
Neisseria Gonorrhea: NEGATIVE

## 2019-03-21 DIAGNOSIS — Z3480 Encounter for supervision of other normal pregnancy, unspecified trimester: Secondary | ICD-10-CM | POA: Diagnosis not present

## 2019-04-05 ENCOUNTER — Other Ambulatory Visit: Payer: Self-pay

## 2019-04-05 ENCOUNTER — Ambulatory Visit (INDEPENDENT_AMBULATORY_CARE_PROVIDER_SITE_OTHER): Payer: Medicaid Other | Admitting: Family Medicine

## 2019-04-05 ENCOUNTER — Encounter: Payer: Self-pay | Admitting: Family Medicine

## 2019-04-05 VITALS — BP 127/71 | HR 91 | Wt 157.1 lb

## 2019-04-05 DIAGNOSIS — R05 Cough: Secondary | ICD-10-CM

## 2019-04-05 DIAGNOSIS — O0992 Supervision of high risk pregnancy, unspecified, second trimester: Secondary | ICD-10-CM

## 2019-04-05 DIAGNOSIS — Z23 Encounter for immunization: Secondary | ICD-10-CM | POA: Diagnosis not present

## 2019-04-05 DIAGNOSIS — L309 Dermatitis, unspecified: Secondary | ICD-10-CM

## 2019-04-05 DIAGNOSIS — O09292 Supervision of pregnancy with other poor reproductive or obstetric history, second trimester: Secondary | ICD-10-CM

## 2019-04-05 DIAGNOSIS — O0990 Supervision of high risk pregnancy, unspecified, unspecified trimester: Secondary | ICD-10-CM | POA: Diagnosis not present

## 2019-04-05 DIAGNOSIS — R059 Cough, unspecified: Secondary | ICD-10-CM

## 2019-04-05 DIAGNOSIS — Z3A15 15 weeks gestation of pregnancy: Secondary | ICD-10-CM

## 2019-04-05 DIAGNOSIS — O09299 Supervision of pregnancy with other poor reproductive or obstetric history, unspecified trimester: Secondary | ICD-10-CM

## 2019-04-05 MED ORDER — OMEPRAZOLE 40 MG PO CPDR: 40.0000 mg | DELAYED_RELEASE_CAPSULE | Freq: Every day | ORAL | 3 refills | Status: DC

## 2019-04-05 MED ORDER — TRIAMCINOLONE ACETONIDE 0.5 % EX OINT: 1.0000 "application " | TOPICAL_OINTMENT | Freq: Two times a day (BID) | CUTANEOUS | 0 refills | Status: DC

## 2019-04-05 NOTE — Progress Notes (Signed)
   PRENATAL VISIT NOTE  Subjective:  Cynthia Wong is a 33 y.o. [id number] at [redacted]w[redacted]d being seen today for ongoing prenatal care.  She is currently monitored for the following issues for this high-risk pregnancy and has Supervision of high risk pregnancy, antepartum and History of stillbirth in currently pregnant patient, unspecified trimester on their problem list.  Patient reports continues to have chronic dry cough - wasn't able to pick up pepcid. Has itching, scaling skin on backs of hands..  Contractions: Not present. Vag. Bleeding: None.  Movement: Absent. Denies leaking of fluid.   The following portions of the patient's history were reviewed and updated as appropriate: allergies, current medications, past family history, past medical history, past social history, past surgical history and problem list.   Objective:   Vitals:   04/05/19 1508  BP: 127/71  Pulse: 91  Weight: 157 lb 1.3 oz (71.3 kg)    Fetal Status: Fetal Heart Rate (bpm): 153 Fundal Height: 14 cm Movement: Absent     General:  Alert, oriented and cooperative. Patient is in no acute distress.  Skin: Skin is warm and dry. No rash noted.   Cardiovascular: Normal heart rate noted  Respiratory: Normal respiratory effort, no problems with respiration noted  Abdomen: Soft, gravid, appropriate for gestational age.  Pain/Pressure: Present     Pelvic: Cervical exam deferred        Extremities: Normal range of motion.  Edema: None  Mental Status: Normal mood and affect. Normal behavior. Normal judgment and thought content.   Assessment and Plan:  Pregnancy: G3T5176 at [redacted]w[redacted]d 1. Supervision of high risk pregnancy, antepartum FHT and FH normal - AFP, Serum, Open Spina Bifida - Flu Vaccine QUAD 36+ mos IM  2. History of stillbirth in currently pregnant patient, unspecified trimester Serial [place]  3. Cough Trial prilosec for GERD  4. Eczema, unspecified type triamcinolone  Preterm labor symptoms and general obstetric  precautions including but not limited to vaginal bleeding, contractions, leaking of fluid and fetal movement were reviewed in detail with the patient. Please refer to After Visit Summary for other counseling recommendations.   Return in about 4 weeks (around 05/03/2019) for OB f/u.  Future Appointments  Date Time Provider [place]  05/01/2019 10:00 AM [place] [facility]  05/01/2019 10:00 AM [facility] [place] 3 [facility]     J , DO

## 2019-04-11 LAB — AFP, SERUM, OPEN SPINA BIFIDA
AFP MoM: 0.79
AFP Value: 25.4 ng/mL
Gest. Age on Collection Date: 15.3 weeks
Maternal Age At EDD: 34.8 yr
OSBR Risk 1 IN: 10000
Test Results:: NEGATIVE
Weight: 157 [lb_av]

## 2019-05-01 ENCOUNTER — Ambulatory Visit (HOSPITAL_COMMUNITY)
Admission: RE | Admit: 2019-05-01 | Discharge: 2019-05-01 | Disposition: A | Discharge status: Home / Self Care | Payer: Medicaid Other | Source: Ambulatory Visit | Attending: Obstetrics and Gynecology | Admitting: Obstetrics and Gynecology

## 2019-05-01 ENCOUNTER — Other Ambulatory Visit: Payer: Self-pay

## 2019-05-01 ENCOUNTER — Encounter (HOSPITAL_COMMUNITY): Payer: Self-pay

## 2019-05-01 ENCOUNTER — Other Ambulatory Visit (HOSPITAL_COMMUNITY): Payer: Self-pay | Admitting: *Deleted

## 2019-05-01 ENCOUNTER — Ambulatory Visit (HOSPITAL_COMMUNITY): Payer: Medicaid Other | Admitting: *Deleted

## 2019-05-01 VITALS — BP 121/74 | HR 87 | Temp 98.8°F

## 2019-05-01 DIAGNOSIS — O0990 Supervision of high risk pregnancy, unspecified, unspecified trimester: Secondary | ICD-10-CM

## 2019-05-01 DIAGNOSIS — O09299 Supervision of pregnancy with other poor reproductive or obstetric history, unspecified trimester: Secondary | ICD-10-CM | POA: Diagnosis not present

## 2019-05-01 DIAGNOSIS — Z3A19 19 weeks gestation of pregnancy: Secondary | ICD-10-CM

## 2019-05-01 DIAGNOSIS — O09292 Supervision of pregnancy with other poor reproductive or obstetric history, second trimester: Secondary | ICD-10-CM | POA: Insufficient documentation

## 2019-05-01 DIAGNOSIS — Z148 Genetic carrier of other disease: Secondary | ICD-10-CM | POA: Diagnosis not present

## 2019-05-01 DIAGNOSIS — Z8759 Personal history of other complications of pregnancy, childbirth and the puerperium: Secondary | ICD-10-CM

## 2019-05-03 ENCOUNTER — Ambulatory Visit (INDEPENDENT_AMBULATORY_CARE_PROVIDER_SITE_OTHER): Payer: Medicaid Other | Admitting: Family Medicine

## 2019-05-03 ENCOUNTER — Other Ambulatory Visit: Payer: Self-pay

## 2019-05-03 ENCOUNTER — Encounter: Payer: Self-pay | Admitting: Family Medicine

## 2019-05-03 VITALS — BP 115/78 | HR 93 | Wt 161.0 lb

## 2019-05-03 DIAGNOSIS — O0992 Supervision of high risk pregnancy, unspecified, second trimester: Secondary | ICD-10-CM

## 2019-05-03 DIAGNOSIS — R059 Cough, unspecified: Secondary | ICD-10-CM

## 2019-05-03 DIAGNOSIS — R05 Cough: Secondary | ICD-10-CM

## 2019-05-03 DIAGNOSIS — O09292 Supervision of pregnancy with other poor reproductive or obstetric history, second trimester: Secondary | ICD-10-CM

## 2019-05-03 DIAGNOSIS — O09299 Supervision of pregnancy with other poor reproductive or obstetric history, unspecified trimester: Secondary | ICD-10-CM

## 2019-05-03 DIAGNOSIS — O0990 Supervision of high risk pregnancy, unspecified, unspecified trimester: Secondary | ICD-10-CM

## 2019-05-03 DIAGNOSIS — Z3A19 19 weeks gestation of pregnancy: Secondary | ICD-10-CM

## 2019-05-03 MED ORDER — PULMICORT FLEXHALER 90 MCG/ACT IN AEPB: 1.0000 | INHALATION_SPRAY | Freq: Two times a day (BID) | RESPIRATORY_TRACT | 3 refills | Status: DC

## 2019-05-03 NOTE — Progress Notes (Signed)
   PRENATAL VISIT NOTE  Subjective:  Cynthia Wong is a 33 y.o. [id number] at [redacted]w[redacted]d being seen today for ongoing prenatal care.  She is currently monitored for the following issues for this high-risk pregnancy and has Supervision of high risk pregnancy, antepartum and History of stillbirth in currently pregnant patient, unspecified trimester on their problem list.  Patient reports still having cough and wheezing. Uses albeterol, which is helpful..  Contractions: Not present.  .  Movement: Present. Denies leaking of fluid.   The following portions of the patient's history were reviewed and updated as appropriate: allergies, current medications, past family history, past medical history, past social history, past surgical history and problem list.   Objective:   Vitals:   05/03/19 1446  BP: 115/78  Pulse: 93    Fetal Status: Fetal Heart Rate (bpm): 150   Movement: Present     General:  Alert, oriented and cooperative. Patient is in no acute distress.  Skin: Skin is warm and dry. No rash noted.   Cardiovascular: Normal heart rate noted  Respiratory: Normal respiratory effort, no problems with respiration noted  Abdomen: Soft, gravid, appropriate for gestational age.  Pain/Pressure: Present     Pelvic: Cervical exam deferred        Extremities: Normal range of motion.  Edema: None  Mental Status: Normal mood and affect. Normal behavior. Normal judgment and thought content.   Assessment and Plan:  Pregnancy: [id number] at [redacted]w[redacted]d 1. Supervision of high risk pregnancy, antepartum FHT and FH normal  2. History of stillbirth in currently pregnant patient, unspecified trimester Serial [place] and will need antenatal testing  3. Cough ? Asthma, as improves with albuterol. Start budesonide.  Preterm labor symptoms and general obstetric precautions including but not limited to vaginal bleeding, contractions, leaking of fluid and fetal movement were reviewed in detail with the patient. Please  refer to After Visit Summary for other counseling recommendations.   Return in about 4 weeks (around 05/31/2019) for OB f/u.  Future Appointments  Date Time Provider [place]  05/30/2019  3:45 PM [facility]  05/30/2019  3:45 PM [place], DO

## 2019-05-04 MED ORDER — FLOVENT HFA 44 MCG/ACT IN AERO: 1.0000 | INHALATION_SPRAY | Freq: Two times a day (BID) | RESPIRATORY_TRACT | 12 refills | Status: DC

## 2019-05-04 NOTE — Addendum Note (Signed)
Addended by: Truett Mainland on: 05/04/2019 11:51 AM   Modules accepted: Orders

## 2019-05-09 ENCOUNTER — Ambulatory Visit (HOSPITAL_COMMUNITY): Payer: Medicaid Other

## 2019-05-16 ENCOUNTER — Ambulatory Visit (HOSPITAL_COMMUNITY): Payer: Self-pay | Admitting: Genetic Counselor

## 2019-05-16 ENCOUNTER — Ambulatory Visit (HOSPITAL_COMMUNITY): Payer: Medicaid Other | Attending: Obstetrics and Gynecology | Admitting: Genetic Counselor

## 2019-05-16 ENCOUNTER — Other Ambulatory Visit: Payer: Self-pay

## 2019-05-16 DIAGNOSIS — Z3A21 21 weeks gestation of pregnancy: Secondary | ICD-10-CM

## 2019-05-16 DIAGNOSIS — Z315 Encounter for genetic counseling: Secondary | ICD-10-CM

## 2019-05-16 DIAGNOSIS — D563 Thalassemia minor: Secondary | ICD-10-CM

## 2019-05-16 DIAGNOSIS — O09299 Supervision of pregnancy with other poor reproductive or obstetric history, unspecified trimester: Secondary | ICD-10-CM

## 2019-05-16 DIAGNOSIS — Z148 Genetic carrier of other disease: Secondary | ICD-10-CM | POA: Diagnosis not present

## 2019-05-16 NOTE — Progress Notes (Signed)
05/16/2019  Cynthia Wong [birth year] MRN: [id number] DOV: 05/16/2019  Cynthia Wong presented to the [facility] for a genetics consultation regarding her carrier status for alpha-thalassemia and her partner's history of G6PD deficiency. Cynthia Wong came to her appointment alone due to COVID-19 visitor restrictions.   Indication for genetic counseling - Silent carrier for alpha-thalassemia - Partner with G6PD deficiency  Prenatal history  Cynthia Wong is a [id number], 33 y.o. female. Her current pregnancy has completed [redacted]w[redacted]d(Estimated Date of Delivery: 09/24/19).  Cynthia Wong exposure to environmental toxins or chemical agents. She denied the use of alcohol, tobacco or street drugs. She reported taking prenatal vitamins, Prevacid, and Albuterol. She denied significant viral illnesses and fevers during the course of her pregnancy. She had some spotting in the first trimester. Her medical and surgical histories were noncontributory.  Family History  A three generation pedigree was drafted and reviewed. The family history is remarkable for the following:  - Ms. FDeiterhas an extensive personal and family history of stillbirths. Ms. FYoungmanhad a daughter who was stillborn at 236 weeks gestation. Amniocentesis and fetal autopsy were both reportedly normal. However, I do not have access to records to determine which genetic tests were ordered on amniocentesis. Ms. FEsperanzasister also had a stillborn daughter at 915 months gestation. A cause for this stillbirth was never identified. Cynthia Wong had two stillborn sons at some point in the third trimester. Ms. FDownummaternal aunt also had a stillborn child in the third trimester. There is no history of blood clots/clotting disorders, lupus, or uterine anomalies in the family. We reviewed that there are many potential causes of pregnancy loss, including anatomic, immunologic, endocrine, and genetic factors.  Abnormalities of chromosome number or structure are the most common cause of sporadic early pregnancy loss. We discussed that 2-5% of couples who experience multiple miscarriages carry a balanced translocation that can become unbalanced and potentially lethal in their offspring; however, late-term losses are less commonly associated with unbalanced translocations. In addition to chromosomal abnormalities, certain single gene, X-linked, and polygenic/multifactorial disorders are associated with an increased risk for pregnancy loss. Given the extensive family history, we discussed that there is compelling evidence for a potential genetic explanation. See Discussion section for more details.  - Ms. Farney's partner, JPauline Good has G6PD deficiency. See Discussion section for more details.  - Mr. Denham's maternal aunt has a son with cerebral palsy. Cerebral palsy (CP) is a group of clinical syndromes that range in severity, characterized by abnormal muscle tone, posture, and movement. Cerebral palsy is due to abnormalities in the developing brain resulting from a variety of causes. The etiology is reported to be multifactorial, with most cases typically due to prenatal factors. Prematurity is the most common association, but in many cases, no cause is identified. A specific genetic cause for cerebral palsy has not been identified, and underlying genetic disorders are relatively uncommon in individuals with CP. Given the reported family history, recurrence risk for the current pregnancy would likely be low. Additional information regarding this cousin's underlying condition or etiology may alter recurrence risk assessment.   The remaining family histories were reviewed and found to be noncontributory for birth defects, intellectual disability, recurrent pregnancy loss, and known genetic conditions.    The patient's ethnicity is African American. The father of the pregnancy's ethnicity is African American.  Ashkenazi Jewish ancestry and consanguinity were denied. Pedigree will be scanned under Media.  Discussion  Cynthia Wong  had Horizon-14 carrier screening performed through [place]. The results of the screen identified her as a silent carrier for alpha-thalassemia (aa/a-). Alpha-thalassemia is different in its inheritance compared to other hemoglobinopathies as there are two copies of two alpha globin genes (HBA1 and HBA2) on each chromosome 16, or four alpha globin genes total (aa/aa). A person can be a carrier of one alpha gene mutation (aa/a-), also referred to as a "silent carrier". A person who carries two alpha globin gene mutations can either carry them in cis (both on the same chromosome, denoted as aa/--) or in trans (on different chromosomes, denoted as a-/a-). Alpha-thalassemia carriers of two mutations who have African American ancestry are more likely to have a trans arrangement (a-/a-); cis configuration is reported to be rare in individuals with African American ancestry.     There are several different forms of alpha-thalassemia. The most severe form of alpha-thalassemia, Hb Barts, is associated with an absence of alpha globin chain synthesis as a result of deletions of all four alpha globin genes (--/--).  Given that Cynthia Wong is a silent carrier (aa/a-), her pregnancies would not be at increased risk for Hb Barts, even if her partner is a carrier for alpha-thalassemia, as she will always pass on at least one copy of the alpha globin gene to her children. Hemoglobin H (HbH) disease is caused by three deleted or dysfunctioning alpha globin alleles (a-/--) and is characterized by microcytic hypochromic hemolytic anemia, hepatosplenomegaly, mild jaundice, growth retardation, and sometimes thalassemia-like bone changes. Given Cynthia Wong's silent carrier status (aa/a-), the current fetus would only be at risk for HbH disease (a-/--), if her partner is a carrier for two alpha globin mutations in cis  (aa/--). If this is the case, the risk for HbH disease in the pregnancy would be 1 in 4 (25%). However, if Cynthia Wong partner is a carrier for two alpha globin mutations, he would be more likely to carry them in trans configuration (a-/a-) than the cis configuration (aa/--), given his ethnicity. If he is a carrier of alpha-thalassemia in trans, then the pregnancy would not be at increased risk for HbH disease. Based on the carrier frequency for alpha-thalassemia in the African American population, Cynthia Wong partner has a 1 in 30 chance of being any type of carrier for alpha-thalassemia. We discussed that carrier screening for alpha-thalassemia is recommended for Cynthia Wong's partner. Ms. Blomgren indicated that she is interested in pursuing partner carrier screening.  Ms. Pal carrier screening was negative for the other 13 conditions screened. Thus, her risk to be a carrier for these additional conditions (listed separately in the laboratory report) and thus her risk to have an affected child has been reduced but not eliminated.   Ms. Abbett partner reportedly has glucose-6-phosphate dehydrogenase (G6PD) deficiency. G6PD deficiency is a condition that mainly affects the red blood cells. The most common feature associated with G6PD deficiency is hemolytic anemia, in which red blood cells are destroyed faster than the body can replace them. Hemolytic anemia is often triggered by infections or certain medications. Other common features of the condition include dark urine, jaundice, fatigue, shortness of breath, and rapid heart rate. G6PD deficiency is also a significant cause of mild to severe jaundice in newborns. However, many individuals with G6PD deficiency may never experience any signs or symptoms of the condition, and in fact may be unaware of their affected status altogether. G6PD deficiency is caused by pathogenic variants in the G6PD gene. This gene encodes instructions for creating  the  protein glucose-6-phosphate dehydrogenase (G6PD). This enzyme is involved in protecting red blood cells from reactive oxygen species, which are byproducts of normal cellular functions in the body. Pathogenic variants in the G6PD gene lead to a reduced amount or altered structure of the G6PD enzyme, allowing reactive oxygen species to accumulate to toxic levels in the body. This damages red blood cells and ultimately causes hemolysis.    G6PD deficiency is inherited in an X-linked pattern. Given that females have two X chromosomes, it is expected thatfemaleshave two copies of the G6PD gene. Since males only have one X chromosome, they only have one copy of the G6PD gene. If males inherit a pathogenic variant in the G6PD gene, that one variant is enough to cause G6PD deficiency. Females may also inherit a pathogenic variant and be carriers for the condition; however, female carriers are often less severely affected by the condition than males and may not display any symptoms at all since they generally have one normally functioning G6PD allele that mitigates the effects of the allele with the pathogenic variant. Males with G6PD deficiency will always have daughters who are carriers for the condition and sons who are unaffected. If Ms. Eckstein is not a carrier for G6PD deficiency, the couple's daughters have a 100% chance of being carriers as well, and their sons do not have an increased risk to be affected by the condition. However, G6PD deficiency is a common condition in the [place] American population with a carrier frequency of ~1 in 5. We discussed that carrier screening for G6PD deficiency is available, though this is not routinely recommended. Ms. Vanderschaaf was not interested in pursuing carrier screening for G6PD deficiency.  We also reviewed that Ms. Domingo Wong had Panorama NIPS through the laboratory [facility] that was low-risk for fetal aneuploidies. We reviewed that these results showed a less than 1 in  10,000 risk for trisomies 21, 18 and 13, and monosomy X (Turner syndrome).  In addition, the risk for triploidy and sex chromosome trisomies (47,XXX and 47,XXY) was also low. Ms. 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 We reviewed that while this testing identifies 94-99% of pregnancies with trisomy 45, trisomy 58, sex chromosome trisomies (47,XXX and 47,XXY), and triploidy, it is NOT diagnostic. A positive test result requires confirmation by CVS or amniocentesis, and a negative test result does not rule out a fetal chromosome abnormality. She also understands that this testing does not identify all genetic conditions.  A complete ultrasound was performed two weeks ago. The ultrasound report was sent under separate cover. There were no visualized fetal anomalies or markers suggestive of aneuploidy.  Ms. Mendia was also counseled regarding diagnostic testing via amniocentesis. We discussed the technical aspects of the procedure and quoted up to a 1 in 500 (0.2%) risk for spontaneous pregnancy loss or other adverse pregnancy outcomes as a result of amniocentesis. Cultured cells from an amniocentesis sample allow for the visualization of a fetal karyotype, which can detect >99% of chromosomal aberrations. Chromosomal microarray can also be performed to identify smaller deletions or duplications of fetal chromosomal material. Amniocentesis could also be performed to assess whether the baby is affected by alpha-thalassemia or G6PD deficiency. Ms. Duling was familiar with the amniocentesis procedure as she had one performed during her pregnancy with her daughter and one performed during her pregnancy with her son to assess for fetal lung maturity. After careful consideration, Ms. Leng declined amniocentesis at this  time. She understands that amniocentesis is available at any point after 16 weeks of pregnancy and that she may opt to undergo the  procedure at a later date should she change her mind.  Ms. Fickel was interested in alpha-thalassemia carrier screening for her partner, Pauline Good. Mr. Harden Mo has health insurance through the [employer], though Ms. Talford was unsure of exactly which insurance plan he had. We discussed that some insurance companies will cover the cost of partner carrier screening when used for procreative information; however, I could not guarantee insurance coverage. We also discussed patient-pay options in which the couple could bypass obtaining insurance coverage altogether and instead pay $250 out of pocket. Mr. Harden Mo recently had surgery and has been having a difficult time gaining insurance coverage for his procedure. Ms. Dicamillo requested that she discuss partner carrier screening with him prior to me placing the order. I sent Ms. Bisono home with a saliva kit for carrier screening and indicated that I can place an order for testing whenever they are ready for me to do so. If they opt not to pursue partner carrier screening, they can discard the saliva kit.  We also discussed additional testing options that are available to assess for the cause of the history of stillbirths in the family. Firstly, we discussed the possibility of maternal chromosome analysis. If Ms. Mosey daughter had a negative karyotype on amniocentesis, a chromosomal abnormality is unlikely to be the explanation for the history of stillbirths in the family. However, given that I do not have records to confirm what testing was ordered on amniocentesis, maternal karyotype analysis for chromosomal rearrangements would be reasonable. Secondly, we discussed the possibility of expanded carrier screening (ECS). ECS evaluates for a wide range of genetic conditions. Some of these conditions are severe and actionable, but also rare; others occur more commonly, but are less severe. We discussed that testing panels could include more than 200 autosomal  recessive or X-linked genetic conditions. In the event that one partner were found to be a carrier for one or more conditions, carrier screening would be available to the partner for those conditions. This could help determine whether her current and future pregnancies are at increased risk for a particular genetic condition that could possibly be associated with an increased risk for stillbirths. Thirdly, we discussed testing anticardiolipin, beta-2-glycoprotein, and lupus anticoagulant, all of which are antibodies associated with antiphospholipid syndrome. Antiphospholipid syndrome is associated with an increased risk of stillbirths during pregnancy. We made a plan for me to check in with Ms. Holben next week about partner carrier screening and testing for the history of stillbirths in the family that she may or may not be interested in. I also indicated that I would research Ms. [employer] insurance coverage for ECS. Ms. Faulconer was agreeable to this plan.   I counseled Ms. Dombrosky regarding the above risks and available options. The approximate face-to-face time with the genetic counselor was 45 minutes.  In summary:  Discussed carrier screening results and options for follow-up testing  Silent carrier for alpha-thalassemia   Desires partner carrier screening. Will contact me once she has spoken with her partner so I can place the order  Reviewed low-risk NIPS results  Reduction in risk forDown syndrome,trisomy 18,trisomy 13,triploidy,sex chromosome aneuploidies, and 22q11.2deletion syndrome  Reviewed results of ultrasound  No fetal anomalies or markers seen  Reduction in risk for fetal aneuploidy  Offered additional testing and screening  Declined amniocentesis  Will determine which, if any, testing options to  pursue for history of stillbirths upon follow-up next week  Reviewed family history concerns  Personal and family history of stillbirths  Partner with G6PD  deficiency  Daughters will always be carriers, sons unaffected (unless Ms. Treu is carrier for the condition)   Buelah Manis, MS Genetic Counselor

## 2019-05-22 ENCOUNTER — Telehealth (HOSPITAL_COMMUNITY): Payer: Self-pay | Admitting: Genetic Counselor

## 2019-05-22 NOTE — Telephone Encounter (Signed)
I called Cynthia Wong to follow up on partner carrier screening for alpha-thalassemia as well as provide her with more details on available testing options to evaluate the cause of her personal and family history of stillbirths. I left a general voicemail requesting a call back to my direct line as no identifiers were provided in the voicemail.  Buelah Manis, MS Genetic Counselor

## 2019-05-30 ENCOUNTER — Other Ambulatory Visit: Payer: Self-pay

## 2019-05-30 ENCOUNTER — Ambulatory Visit (HOSPITAL_COMMUNITY): Payer: Medicaid Other

## 2019-05-30 ENCOUNTER — Ambulatory Visit (HOSPITAL_COMMUNITY): Payer: Medicaid Other | Admitting: *Deleted

## 2019-05-30 ENCOUNTER — Encounter (HOSPITAL_COMMUNITY): Payer: Self-pay

## 2019-05-30 ENCOUNTER — Ambulatory Visit (HOSPITAL_COMMUNITY)
Admission: RE | Admit: 2019-05-30 | Discharge: 2019-05-30 | Disposition: A | Discharge status: Home / Self Care | Payer: Medicaid Other | Source: Ambulatory Visit | Attending: Obstetrics and Gynecology | Admitting: Obstetrics and Gynecology

## 2019-05-30 VITALS — BP 131/80 | HR 91 | Temp 96.6°F

## 2019-05-30 DIAGNOSIS — Z8489 Family history of other specified conditions: Secondary | ICD-10-CM | POA: Insufficient documentation

## 2019-05-30 DIAGNOSIS — Z8759 Personal history of other complications of pregnancy, childbirth and the puerperium: Secondary | ICD-10-CM | POA: Diagnosis not present

## 2019-05-30 DIAGNOSIS — O09292 Supervision of pregnancy with other poor reproductive or obstetric history, second trimester: Secondary | ICD-10-CM

## 2019-05-30 DIAGNOSIS — Z3A23 23 weeks gestation of pregnancy: Secondary | ICD-10-CM | POA: Diagnosis not present

## 2019-05-30 DIAGNOSIS — D563 Thalassemia minor: Secondary | ICD-10-CM

## 2019-05-30 DIAGNOSIS — Z362 Encounter for other antenatal screening follow-up: Secondary | ICD-10-CM

## 2019-06-02 DIAGNOSIS — Z20828 Contact with and (suspected) exposure to other viral communicable diseases: Secondary | ICD-10-CM | POA: Diagnosis not present

## 2019-06-04 ENCOUNTER — Other Ambulatory Visit (HOSPITAL_COMMUNITY): Payer: Self-pay | Admitting: *Deleted

## 2019-06-04 DIAGNOSIS — O09293 Supervision of pregnancy with other poor reproductive or obstetric history, third trimester: Secondary | ICD-10-CM

## 2019-06-04 DIAGNOSIS — D563 Thalassemia minor: Secondary | ICD-10-CM | POA: Insufficient documentation

## 2019-06-04 NOTE — Progress Notes (Deleted)
Subjective:  Cynthia Wong is a 33 y.o. [id number] at [redacted]w[redacted]d being seen today for ongoing prenatal care.  She is currently monitored for the following issues for this high-risk pregnancy and has Supervision of high risk pregnancy, antepartum; History of stillbirth in currently pregnant patient, unspecified trimester; and Alpha thalassemia silent carrier on their problem list.  Patient reports {sx:14538}.   .  .   . Denies leaking of fluid.   The following portions of the patient's history were reviewed and updated as appropriate: allergies, current medications, past family history, past medical history, past social history, past surgical history and problem list. Problem list updated.  Objective:  There were no vitals filed for this visit.  Fetal Status:           General:  Alert, oriented and cooperative. Patient is in no acute distress.  Skin: Skin is warm and dry. No rash noted.   Cardiovascular: Normal heart rate noted  Respiratory: Normal respiratory effort, no problems with respiration noted  Abdomen: Soft, gravid, appropriate for gestational age.       Pelvic:       {Blank single:19197::"Cervical exam performed","Cervical exam deferred"}        Extremities: Normal range of motion.     Mental Status: Normal mood and affect. Normal behavior. Normal judgment and thought content.   Urinalysis:      Assessment and Plan:  Pregnancy: [id number] at [redacted]w[redacted]d  1. Supervision of high risk pregnancy, antepartum *** - budosenide for asthma? - f/u anatomy scheduled 06/27/2019, pt aware - flu vaccine - peds list given - support person is *** - discussed circumcision, pt elects *** - discussed contraception, pt elects ***  2. History of stillbirth in currently pregnant patient, unspecified trimester *** - start antenatal testing? - next growth scan 06/27/2019, per MFM serial growth [place] for prior hx of IUFD - antibodies negative  3. Alpha thalassemia silent carrier *** - genetics consult  05/16/2019  Preterm labor symptoms and general obstetric precautions including but not limited to vaginal bleeding, contractions, leaking of fluid and fetal movement were reviewed in detail with the patient. Please refer to After Visit Summary for other counseling recommendations.  No follow-ups on file.   Keydi Giel, Gerrie Nordmann, NP

## 2019-06-05 ENCOUNTER — Encounter: Payer: Medicaid Other | Admitting: Women's Health

## 2019-06-07 ENCOUNTER — Ambulatory Visit (INDEPENDENT_AMBULATORY_CARE_PROVIDER_SITE_OTHER): Payer: Medicaid Other | Admitting: Family Medicine

## 2019-06-07 ENCOUNTER — Other Ambulatory Visit: Payer: Self-pay

## 2019-06-07 VITALS — BP 121/80 | HR 91 | Wt 164.0 lb

## 2019-06-07 DIAGNOSIS — R059 Cough, unspecified: Secondary | ICD-10-CM

## 2019-06-07 DIAGNOSIS — O09299 Supervision of pregnancy with other poor reproductive or obstetric history, unspecified trimester: Secondary | ICD-10-CM

## 2019-06-07 DIAGNOSIS — R05 Cough: Secondary | ICD-10-CM

## 2019-06-07 DIAGNOSIS — O0990 Supervision of high risk pregnancy, unspecified, unspecified trimester: Secondary | ICD-10-CM

## 2019-06-07 DIAGNOSIS — O0992 Supervision of high risk pregnancy, unspecified, second trimester: Secondary | ICD-10-CM

## 2019-06-07 DIAGNOSIS — Z3A24 24 weeks gestation of pregnancy: Secondary | ICD-10-CM

## 2019-06-07 DIAGNOSIS — D563 Thalassemia minor: Secondary | ICD-10-CM

## 2019-06-07 DIAGNOSIS — O09292 Supervision of pregnancy with other poor reproductive or obstetric history, second trimester: Secondary | ICD-10-CM

## 2019-06-07 LAB — LUPUS ANTICOAGULANT
Dilute Viper Venom Time: 27.6 s (ref 0.0–47.0)
PTT Lupus Anticoagulant: 30.2 s (ref 0.0–51.9)
Thrombin Time: 16.3 s (ref 0.0–23.0)
dPT Confirm Ratio: 0.88 Ratio (ref 0.00–1.40)
dPT: 29.3 s (ref 0.0–55.0)

## 2019-06-07 LAB — BETA-2-GLYCOPROTEIN I ABS, IGG/M/A
Beta-2 Glyco 1 IgA: <9 GPI IgA units (ref 0–25)
Beta-2 Glyco 1 IgM: <9 GPI IgM units (ref 0–32)
Beta-2 Glyco I IgG: <9 GPI IgG units (ref 0–20)

## 2019-06-07 LAB — CHROMOSOME, BLOOD, ROUTINE
Cells Analyzed: 20
Cells Counted: 20
Cells Karyotyped: 2
GTG Band Resolution Achieved: 500

## 2019-06-07 LAB — CARDIOLIPIN ANTIBODIES, IGM+IGG
Anticardiolipin IgG: <9 GPL U/mL (ref 0–14)
Anticardiolipin IgM: 12 MPL U/mL (ref 0–12)

## 2019-06-07 MED ORDER — BUDESONIDE-FORMOTEROL FUMARATE 80-4.5 MCG/ACT IN AERO: 2.0000 | INHALATION_SPRAY | Freq: Two times a day (BID) | RESPIRATORY_TRACT | 12 refills | Status: DC

## 2019-06-07 MED ORDER — PANTOPRAZOLE SODIUM 40 MG PO TBEC: 40.0000 mg | DELAYED_RELEASE_TABLET | Freq: Every day | ORAL | 3 refills | Status: DC

## 2019-06-07 NOTE — Progress Notes (Signed)
   PRENATAL VISIT NOTE  Subjective:  Cynthia Wong is a 33 y.o. [id number] at [redacted]w[redacted]d being seen today for ongoing prenatal care.  She is currently monitored for the following issues for this high-risk pregnancy and has Supervision of high risk pregnancy, antepartum; History of stillbirth in currently pregnant patient, unspecified trimester; and Alpha thalassemia silent carrier on their problem list.  Patient reports continued cough. Feels that cough slightly improved for a time with Flovent, but still having frequent coughing. Not currently on pepcid. Clears throat frequently. Albuterol helpful..  Contractions: Irritability. Vag. Bleeding: None.  Movement: Present. Denies leaking of fluid.   The following portions of the patient's history were reviewed and updated as appropriate: allergies, current medications, past family history, past medical history, past social history, past surgical history and problem list.   Objective:   Vitals:   06/07/19 1440  BP: 121/80  Pulse: 91  Weight: 164 lb (74.4 kg)    Fetal Status:     Movement: Present     General:  Alert, oriented and cooperative. Patient is in no acute distress.  Skin: Skin is warm and dry. No rash noted.   Cardiovascular: Normal heart rate noted  Respiratory: CTA. Normal respiratory effort, no problems with respiration noted  Abdomen: Soft, gravid, appropriate for gestational age.  Pain/Pressure: Present     Pelvic: Cervical exam deferred        Extremities: Normal range of motion.  Edema: None  Mental Status: Normal mood and affect. Normal behavior. Normal judgment and thought content.   Assessment and Plan:  Pregnancy: [id number] at [redacted]w[redacted]d  1. Supervision of high risk pregnancy, antepartum FHT and FH normal. Had some contractions. She discussed symptoms with MFM - CL was fine. Pt declined cervical exam today.  2. History of stillbirth in currently pregnant patient, unspecified trimester Growth [place]. Antenatal testing.  3.  Cough Will change flovent to symbicort. Add protonix to reduce acid. If continues, will refer to pulmonology.  4. Alpha thalassemia silent carrier  Preterm labor symptoms and general obstetric precautions including but not limited to vaginal bleeding, contractions, leaking of fluid and fetal movement were reviewed in detail with the patient. Please refer to After Visit Summary for other counseling recommendations.   No follow-ups on file.  Future Appointments  Date Time Provider State Center  06/27/2019  3:45 PM [facility] [place] [facility]  06/27/2019  4:00 PM [place] [facility]  07/09/2019  8:30 AM Lavonia Drafts, MD [facility] None    Truett Mainland, DO

## 2019-06-07 NOTE — Progress Notes (Signed)
Patient states she contracted for "four days straight last week". Patient states she didn't count between contractions. Kathrene Alu RN

## 2019-06-11 ENCOUNTER — Telehealth (HOSPITAL_COMMUNITY): Payer: Self-pay | Admitting: Genetic Counselor

## 2019-06-11 NOTE — Telephone Encounter (Signed)
LVM for Cynthia Wong indicating that I had her testing results back. At her last ultrasound appointment, Cynthia Wong decided to undergo testing for antiphospholipid syndrome and karyotype analysis for her personal and family history of stillbirth. All results were negative. Requested a call back to my direct line to discuss these in more detail, as no identifiers were provided in voicemail message.   Buelah Manis, MS Genetic Counselor

## 2019-06-12 ENCOUNTER — Other Ambulatory Visit: Payer: Self-pay

## 2019-06-12 ENCOUNTER — Ambulatory Visit
Admission: EM | Admit: 2019-06-12 | Discharge: 2019-06-12 | Disposition: A | Discharge status: Home / Self Care | Payer: Medicaid Other | Attending: Physician Assistant | Admitting: Physician Assistant

## 2019-06-12 ENCOUNTER — Encounter: Payer: Self-pay | Admitting: Emergency Medicine

## 2019-06-12 DIAGNOSIS — M7582 Other shoulder lesions, left shoulder: Secondary | ICD-10-CM

## 2019-06-12 DIAGNOSIS — M778 Other enthesopathies, not elsewhere classified: Secondary | ICD-10-CM

## 2019-06-12 MED ORDER — ACETAMINOPHEN 500 MG PO TABS: 1000.0000 mg | ORAL_TABLET | Freq: Three times a day (TID) | ORAL | 0 refills | Status: DC | PRN

## 2019-06-12 NOTE — ED Notes (Signed)
Patient able to ambulate independently  

## 2019-06-12 NOTE — ED Provider Notes (Signed)
[facility]    CSN: [id number] Arrival date & time: 06/12/19  1404      History   Chief Complaint Chief Complaint  Patient presents with  . Shoulder Pain    HPI Cynthia Wong is a 33 y.o. female.   33 year old female who is [redacted] weeks pregnant comes in for 3-day history of left shoulder pain.  States has had to sleep on left shoulder since being pregnant.  Woke up 3 days ago with pain to the shoulder that is tender to touch, and radiates depending on positioning.  Denies any injury/trauma.  Denies swelling, erythema, warmth.  Denies loss of grip strength.  Has been taking Tylenol, Biofreeze with some relief.  Patient states unable to lay on the back and right due to cough and shortness of breath from asthma exacerbation.  She has been tested multiple times for Covid with negative results. Denies shortness of breath, chest pain. Denies abdominal pain, vaginal bleeding.      Past Medical History:  Diagnosis Date  . Bronchitis   . Vaginal Pap smear, abnormal    colposcopy    Patient Active Problem List   Diagnosis Date Noted  . Alpha thalassemia silent carrier 06/04/2019  . Supervision of high risk pregnancy, antepartum 03/08/2019  . History of stillbirth in currently pregnant patient, unspecified trimester 03/08/2019    History reviewed. No pertinent surgical history.  OB History    Gravida  6   Para  2   Term  1   Preterm  1   AB  3   Living  1     SAB  1   TAB  2   Ectopic  0   Multiple  0   Live Births  1            Home Medications    Prior to Admission medications   Medication Sig Start Date End Date Taking? Authorizing Provider  budesonide-formoterol (SYMBICORT) 80-4.5 MCG/ACT inhaler Inhale 2 puffs into the lungs 2 (two) times daily. 06/07/19  Yes Truett Mainland, DO  pantoprazole (PROTONIX) 40 MG tablet Take 1 tablet (40 mg total) by mouth daily. 06/07/19  Yes Truett Mainland, DO  Prenatal Vit-Fe Fumarate-FA (PRENATAL  VITAMIN PO) Take by mouth.   Yes [provider]  triamcinolone ointment (KENALOG) 0.5 % Apply 1 application topically 2 (two) times daily. 04/05/19  Yes Truett Mainland, DO  acetaminophen (TYLENOL) 500 MG tablet Take 2 tablets (1,000 mg total) by mouth every 8 (eight) hours as needed. 06/12/19   Tasia Catchings, Jalexus Brett V, PA-C  albuterol (VENTOLIN HFA) 108 (90 Base) MCG/ACT inhaler Inhale into the lungs. 10/27/18 10/27/19  [provider]  AMBULATORY NON FORMULARY MEDICATION 1 Device by Other route once a week. Blood pressure cuff/Medium  Monitored regularly at home ICD 10: Z34.90 LROB 03/09/19   Lavonia Drafts, MD  fluticasone (FLOVENT HFA) 44 MCG/ACT inhaler Inhale 1 puff into the lungs 2 (two) times daily. 05/04/19 06/12/19  Truett Mainland, DO    Family History Family History  Problem Relation Age of Onset  . Hypertension Mother   . Diabetes Mother   . Asthma Brother   . Cancer Maternal Grandmother        pancreas  . Cancer Paternal Grandmother     Social History Social History   Tobacco Use  . Smoking status: Never Smoker  . Smokeless tobacco: Never Used  Substance Use Topics  . Alcohol use: Not Currently  . Drug  use: No     Allergies   Patient has no known allergies.   Review of Systems Review of Systems  Reason unable to perform ROS: See HPI as above.     Physical Exam Triage Vital Signs ED Triage Vitals  Enc Vitals Group     BP 06/12/19 1429 133/81     Pulse Rate 06/12/19 1429 97     Resp 06/12/19 1429 16     Temp 06/12/19 1429 98.8 F (37.1 C)     Temp Source 06/12/19 1429 Oral     SpO2 06/12/19 1429 98 %     Weight --      Height --      Head Circumference --      Peak Flow --      Pain Score 06/12/19 1436 8     Pain Loc --      Pain Edu? --      Excl. in GC? --    No data found.  Updated Vital Signs BP 133/81 (BP Location: Right Arm)   Pulse 97   Temp 98.8 F (37.1 C) (Oral)   Resp 16   LMP 12/18/2018   SpO2 98%   Physical  Exam Constitutional:      General: She is not in acute distress.    Appearance: She is well-developed. She is not diaphoretic.  HENT:     Head: Normocephalic and atraumatic.  Eyes:     Conjunctiva/sclera: Conjunctivae normal.     Pupils: Pupils are equal, round, and reactive to light.  Pulmonary:     Effort: Pulmonary effort is normal. No respiratory distress.  Musculoskeletal:     Comments: No swelling, erythema, warmth, contusion.  No tenderness to palpation of the neck, thoracic back.  Tenderness to palpation along the deltoid.  No bony tenderness.  Decreased range of motion due to pain.  Full range of motion of elbow, wrist.  Strength deferred to the shoulder.  Strength to the elbow, wrist 4 out of 5 due to shoulder pain.  Sensation intact ankle bilaterally.  Radial pulse 2+, cap refill less than 2 seconds.  Skin:    General: Skin is warm and dry.  Neurological:     Mental Status: She is alert and oriented to person, place, and time.      UC Treatments / Results  Labs (all labs ordered are listed, but only abnormal results are displayed) Labs Reviewed - No data to display  EKG   Radiology No results found.  Procedures Procedures (including critical care time)  Medications Ordered in UC Medications - No data to display  Initial Impression / Assessment and Plan / UC Course  I have reviewed the triage vital signs and the nursing notes.  Pertinent labs & imaging results that were available during my care of the patient were reviewed by me and considered in my medical decision making (see chart for details).    Will have patient continue Tylenol, ice compress.  Discussed position changes/pillow arrangements to help relieve pressure to the left shoulder during sleep.  Return precautions given.  Patient expresses understanding and agrees to plan.  Final Clinical Impressions(s) / UC Diagnoses   Final diagnoses:  Left shoulder tendinitis   ED Prescriptions    Medication  Sig Dispense Auth. Provider   acetaminophen (TYLENOL) 500 MG tablet Take 2 tablets (1,000 mg total) by mouth every 8 (eight) hours as needed. 30 tablet Belinda FisherYu, Derian Dimalanta V, PA-C     PDMP not reviewed this  encounter.   Belinda Fisher, PA-C 06/12/19 1719

## 2019-06-12 NOTE — Discharge Instructions (Signed)
Tylenol 1000 mg 3 times a day as needed for pain.  Ice compress for 15 to 20 minutes 2-3 times a day.  Follow-up with PCP for further evaluation if symptoms not improving.

## 2019-06-12 NOTE — ED Triage Notes (Signed)
Pt presents to [facility] for assessment of left shoulder pain x 3 days.  States she woke up to it.  States she is [redacted] weeks pregnant and having to sleep on her left side, and states the shoulder is tender to touch, causes shooting pain when she lays on it, and is difficult to have full ROM.

## 2019-06-27 ENCOUNTER — Ambulatory Visit (HOSPITAL_COMMUNITY)
Admission: RE | Admit: 2019-06-27 | Discharge: 2019-06-27 | Disposition: A | Discharge status: Home / Self Care | Payer: Medicaid Other | Source: Ambulatory Visit | Attending: Obstetrics | Admitting: Obstetrics

## 2019-06-27 ENCOUNTER — Ambulatory Visit (HOSPITAL_COMMUNITY): Payer: Medicaid Other | Admitting: *Deleted

## 2019-06-27 ENCOUNTER — Other Ambulatory Visit: Payer: Self-pay

## 2019-06-27 ENCOUNTER — Encounter (HOSPITAL_COMMUNITY): Payer: Self-pay

## 2019-06-27 DIAGNOSIS — D563 Thalassemia minor: Secondary | ICD-10-CM | POA: Diagnosis not present

## 2019-06-27 DIAGNOSIS — Z3A27 27 weeks gestation of pregnancy: Secondary | ICD-10-CM | POA: Diagnosis not present

## 2019-06-27 DIAGNOSIS — O09293 Supervision of pregnancy with other poor reproductive or obstetric history, third trimester: Secondary | ICD-10-CM | POA: Diagnosis not present

## 2019-06-27 DIAGNOSIS — Z362 Encounter for other antenatal screening follow-up: Secondary | ICD-10-CM

## 2019-06-27 DIAGNOSIS — O09292 Supervision of pregnancy with other poor reproductive or obstetric history, second trimester: Secondary | ICD-10-CM | POA: Diagnosis not present

## 2019-06-28 ENCOUNTER — Other Ambulatory Visit (HOSPITAL_COMMUNITY): Payer: Self-pay | Admitting: *Deleted

## 2019-06-28 DIAGNOSIS — Z8759 Personal history of other complications of pregnancy, childbirth and the puerperium: Secondary | ICD-10-CM

## 2019-07-06 NOTE — L&D Delivery Note (Signed)
OB/GYN Faculty Practice Delivery Note  Cynthia Wong is a 34 y.o. [id number] s/p NSVD at [redacted]w[redacted]d. She was admitted for SROM and SOL.   ROM: 2h 71m with clear fluid GBS Status:  Positive/-- (02/26 1057), inadequate treatment Maximum Maternal Temperature: 98.2 F  Labor Progress: . Patient presented to L&D for SROM and SOL. Initial SVE: 4cm. Labor course was uncomplicated. She then rapidly progressed to complete.   Delivery Date/Time: 09/04/2019 @ 1138 Delivery: Called to room and patient was complete and pushing. Head position was LOA and delivered with ease over the perineum. No nuchal cord present. Shoulder and body delivered in usual fashion. Infant with spontaneous cry, placed on mother's abdomen, dried and stimulated. Cord clamped x 2 after 1-minute delay, and cut by FOB. Cord blood drawn. Placenta delivered spontaneously with gentle cord traction. Fundus firm with massage and pitocin started. Labia, perineum, vagina, and cervix inspected and significant for bilateral periuretheral lacerations.   Baby Weight: 2926 grams  Cord: central insertion, 3 vessel Placenta: Sent to L&D Complications: None Lacerations: Bilateral periurethral, R side was repaired in the standard fashion EBL: 250cc Analgesia: Un-medicated labor and local anaesthesia for repair  Infant: APGAR (1 MIN): 8   APGAR (5 MINS): 9    Aldean Jewett MD, PGY-1 OBGYN Faculty Teaching Service  09/04/2019, 12:08 PM   OB FELLOW ATTESTATION  I was present, gloved, and supervising throughout delivery and have edited the above note to reflect any changes or updates.  Zack Seal, MD/[facility] OB Fellow  09/04/2019, 3:04 PM

## 2019-07-09 ENCOUNTER — Encounter: Payer: Self-pay | Admitting: Obstetrics & Gynecology

## 2019-07-09 ENCOUNTER — Other Ambulatory Visit: Payer: Self-pay

## 2019-07-09 ENCOUNTER — Ambulatory Visit (INDEPENDENT_AMBULATORY_CARE_PROVIDER_SITE_OTHER): Payer: Medicaid Other | Admitting: Obstetrics & Gynecology

## 2019-07-09 VITALS — BP 116/80 | HR 92 | Wt 166.0 lb

## 2019-07-09 DIAGNOSIS — O0990 Supervision of high risk pregnancy, unspecified, unspecified trimester: Secondary | ICD-10-CM | POA: Diagnosis not present

## 2019-07-09 DIAGNOSIS — O09299 Supervision of pregnancy with other poor reproductive or obstetric history, unspecified trimester: Secondary | ICD-10-CM

## 2019-07-09 DIAGNOSIS — Z23 Encounter for immunization: Secondary | ICD-10-CM

## 2019-07-09 DIAGNOSIS — O09293 Supervision of pregnancy with other poor reproductive or obstetric history, third trimester: Secondary | ICD-10-CM

## 2019-07-09 DIAGNOSIS — O0993 Supervision of high risk pregnancy, unspecified, third trimester: Secondary | ICD-10-CM

## 2019-07-09 DIAGNOSIS — R0609 Other forms of dyspnea: Secondary | ICD-10-CM

## 2019-07-09 DIAGNOSIS — Z3A29 29 weeks gestation of pregnancy: Secondary | ICD-10-CM

## 2019-07-09 NOTE — Progress Notes (Signed)
   PRENATAL VISIT NOTE  Subjective:  Cynthia Wong is a 34 y.o. [id number] at [redacted]w[redacted]d being seen today for ongoing prenatal care.  She is currently monitored for the following issues for this high-risk pregnancy and has Supervision of high risk pregnancy, antepartum; History of stillbirth in currently pregnant patient, unspecified trimester; and Alpha thalassemia silent carrier on their problem list.  Patient reports occasional contractions.  Contractions: Irritability. Vag. Bleeding: None.  Movement: Present. Denies leaking of fluid.   The following portions of the patient's history were reviewed and updated as appropriate: allergies, current medications, past family history, past medical history, past social history, past surgical history and problem list.   Objective:   Vitals:   07/09/19 0849  BP: 116/80  Pulse: 92  Weight: 166 lb (75.3 kg)    Fetal Status: Fetal Heart Rate (bpm): 145   Movement: Present     General:  Alert, oriented and cooperative. Patient is in no acute distress.  Skin: Skin is warm and dry. No rash noted.   Cardiovascular: Normal heart rate noted  Respiratory: Normal respiratory effort, no problems with respiration noted  Abdomen: Soft, gravid, appropriate for gestational age.  Pain/Pressure: Present     Pelvic: Cervical exam deferred        Extremities: Normal range of motion.  Edema: None  Mental Status: Normal mood and affect. Normal behavior. Normal judgment and thought content.   Assessment and Plan:  Pregnancy: [id number] at [redacted]w[redacted]d 1. Supervision of high risk pregnancy, antepartum  - Glucose Tolerance, 2 Hours w/1 Hour - RPR - CBC - HIV antibody (with reflex)  2. History of stillbirth in currently pregnant patient, unspecified trimester  3. Dyspnea on exertion/SOB  Pt describes SOB even with mild exertion. She reports that it has been getting worse as the pregnancy progresses. She reports that it occurs even with minor exertion.    Will order  ECHO to further eval.    Preterm labor symptoms and general obstetric precautions including but not limited to vaginal bleeding, contractions, leaking of fluid and fetal movement were reviewed in detail with the patient. Please refer to After Visit Summary for other counseling recommendations.   Return in about 2 weeks (around 07/23/2019) for in person.  Future Appointments  Date Time Provider Department Center  07/25/2019  3:15 PM [facility] NURSE [facility]  07/25/2019  3:15 PM [facility] [place] 4 [facility]  08/01/2019  3:00 PM [facility] NURSE [facility]  08/01/2019  3:00 PM [facility] [place] 3 [facility]    Willodean Rosenthal, MD

## 2019-07-09 NOTE — Patient Instructions (Signed)
Third Trimester of Pregnancy The third trimester is from week 28 through week 40 (months 7 through 9). The third trimester is a time when the unborn baby (fetus) is growing rapidly. At the end of the ninth month, the fetus is about 20 inches in length and weighs 6-10 pounds. Body changes during your third trimester Your body will continue to go through many changes during pregnancy. The changes vary from woman to woman. During the third trimester:  Your weight will continue to increase. You can expect to gain 25-35 pounds (11-16 kg) by the end of the pregnancy.  You may begin to get stretch marks on your hips, abdomen, and breasts.  You may urinate more often because the fetus is moving lower into your pelvis and pressing on your bladder.  You may develop or continue to have heartburn. This is caused by increased hormones that slow down muscles in the digestive tract.  You may develop or continue to have constipation because increased hormones slow digestion and cause the muscles that push waste through your intestines to relax.  You may develop hemorrhoids. These are swollen veins (varicose veins) in the rectum that can itch or be painful.  You may develop swollen, bulging veins (varicose veins) in your legs.  You may have increased body aches in the pelvis, back, or thighs. This is due to weight gain and increased hormones that are relaxing your joints.  You may have changes in your hair. These can include thickening of your hair, rapid growth, and changes in texture. Some women also have hair loss during or after pregnancy, or hair that feels dry or thin. Your hair will most likely return to normal after your baby is born.  Your breasts will continue to grow and they will continue to become tender. A yellow fluid (colostrum) may leak from your breasts. This is the first milk you are producing for your baby.  Your belly button may stick out.  You may notice more swelling in your hands,  face, or ankles.  You may have increased tingling or numbness in your hands, arms, and legs. The skin on your belly may also feel numb.  You may feel short of breath because of your expanding uterus.  You may have more problems sleeping. This can be caused by the size of your belly, increased need to urinate, and an increase in your body's metabolism.  You may notice the fetus "dropping," or moving lower in your abdomen (lightening).  You may have increased vaginal discharge.  You may notice your joints feel loose and you may have pain around your pelvic bone. What to expect at prenatal visits You will have prenatal exams every 2 weeks until week 36. Then you will have weekly prenatal exams. During a routine prenatal visit:  You will be weighed to make sure you and the baby are growing normally.  Your blood pressure will be taken.  Your abdomen will be measured to track your baby's growth.  The fetal heartbeat will be listened to.  Any test results from the previous visit will be discussed.  You may have a cervical check near your due date to see if your cervix has softened or thinned (effaced).  You will be tested for Group B streptococcus. This happens between 35 and 37 weeks. Your health care provider may ask you:  What your birth plan is.  How you are feeling.  If you are feeling the baby move.  If you have had any abnormal   symptoms, such as leaking fluid, bleeding, severe headaches, or abdominal cramping.  If you are using any tobacco products, including cigarettes, chewing tobacco, and electronic cigarettes.  If you have any questions. Other tests or screenings that may be performed during your third trimester include:  Blood tests that check for low iron levels (anemia).  Fetal testing to check the health, activity level, and growth of the fetus. Testing is done if you have certain medical conditions or if there are problems during the pregnancy.  Nonstress test  (NST). This test checks the health of your baby to make sure there are no signs of problems, such as the baby not getting enough oxygen. During this test, a belt is placed around your belly. The baby is made to move, and its heart rate is monitored during movement. What is false labor? False labor is a condition in which you feel small, irregular tightenings of the muscles in the womb (contractions) that usually go away with rest, changing position, or drinking water. These are called Braxton Hicks contractions. Contractions may last for hours, days, or even weeks before true labor sets in. If contractions come at regular intervals, become more frequent, increase in intensity, or become painful, you should see your health care provider. What are the signs of labor?  Abdominal cramps.  Regular contractions that start at 10 minutes apart and become stronger and more frequent with time.  Contractions that start on the top of the uterus and spread down to the lower abdomen and back.  Increased pelvic pressure and dull back pain.  A watery or bloody mucus discharge that comes from the vagina.  Leaking of amniotic fluid. This is also known as your "water breaking." It could be a slow trickle or a gush. Let your health care provider know if it has a color or strange odor. If you have any of these signs, call your health care provider right away, even if it is before your due date. Follow these instructions at home: Medicines  Follow your health care provider's instructions regarding medicine use. Specific medicines may be either safe or unsafe to take during pregnancy.  Take a prenatal vitamin that contains at least 600 micrograms (mcg) of folic acid.  If you develop constipation, try taking a stool softener if your health care provider approves. Eating and drinking   Eat a balanced diet that includes fresh fruits and vegetables, whole grains, good sources of protein such as meat, eggs, or tofu,  and low-fat dairy. Your health care provider will help you determine the amount of weight gain that is right for you.  Avoid raw meat and uncooked cheese. These carry germs that can cause birth defects in the baby.  If you have low calcium intake from food, talk to your health care provider about whether you should take a daily calcium supplement.  Eat four or five small meals rather than three large meals a day.  Limit foods that are high in fat and processed sugars, such as fried and sweet foods.  To prevent constipation: ? Drink enough fluid to keep your urine clear or pale yellow. ? Eat foods that are high in fiber, such as fresh fruits and vegetables, whole grains, and beans. Activity  Exercise only as directed by your health care provider. Most women can continue their usual exercise routine during pregnancy. Try to exercise for 30 minutes at least 5 days a week. Stop exercising if you experience uterine contractions.  Avoid heavy lifting.  Do   not exercise in extreme heat or humidity, or at high altitudes.  Wear low-heel, comfortable shoes.  Practice good posture.  You may continue to have sex unless your health care provider tells you otherwise. Relieving pain and discomfort  Take frequent breaks and rest with your legs elevated if you have leg cramps or low back pain.  Take warm sitz baths to soothe any pain or discomfort caused by hemorrhoids. Use hemorrhoid cream if your health care provider approves.  Wear a good support bra to prevent discomfort from breast tenderness.  If you develop varicose veins: ? Wear support pantyhose or compression stockings as told by your healthcare provider. ? Elevate your feet for 15 minutes, 3-4 times a day. Prenatal care  Write down your questions. Take them to your prenatal visits.  Keep all your prenatal visits as told by your health care provider. This is important. Safety  Wear your seat belt at all times when driving.  Make  a list of emergency phone numbers, including numbers for family, friends, the hospital, and police and fire departments. General instructions  Avoid cat litter boxes and soil used by cats. These carry germs that can cause birth defects in the baby. If you have a cat, ask someone to clean the litter box for you.  Do not travel far distances unless it is absolutely necessary and only with the approval of your health care provider.  Do not use hot tubs, steam rooms, or saunas.  Do not drink alcohol.  Do not use any products that contain nicotine or tobacco, such as cigarettes and e-cigarettes. If you need help quitting, ask your health care provider.  Do not use any medicinal herbs or unprescribed drugs. These chemicals affect the formation and growth of the baby.  Do not douche or use tampons or scented sanitary pads.  Do not cross your legs for long periods of time.  To prepare for the arrival of your baby: ? Take prenatal classes to understand, practice, and ask questions about labor and delivery. ? Make a trial run to the hospital. ? Visit the hospital and tour the maternity area. ? Arrange for maternity or paternity leave through employers. ? Arrange for family and friends to take care of pets while you are in the hospital. ? Purchase a rear-facing car seat and make sure you know how to install it in your car. ? Pack your hospital bag. ? Prepare the baby's nursery. Make sure to remove all pillows and stuffed animals from the baby's crib to prevent suffocation.  Visit your dentist if you have not gone during your pregnancy. Use a soft toothbrush to brush your teeth and be gentle when you floss. Contact a health care provider if:  You are unsure if you are in labor or if your water has broken.  You become dizzy.  You have mild pelvic cramps, pelvic pressure, or nagging pain in your abdominal area.  You have lower back pain.  You have persistent nausea, vomiting, or  diarrhea.  You have an unusual or bad smelling vaginal discharge.  You have pain when you urinate. Get help right away if:  Your water breaks before 37 weeks.  You have regular contractions less than 5 minutes apart before 37 weeks.  You have a fever.  You are leaking fluid from your vagina.  You have spotting or bleeding from your vagina.  You have severe abdominal pain or cramping.  You have rapid weight loss or weight gain.  You have   shortness of breath with chest pain.  You notice sudden or extreme swelling of your face, hands, ankles, feet, or legs.  Your baby makes fewer than 10 movements in 2 hours.  You have severe headaches that do not go away when you take medicine.  You have vision changes. Summary  The third trimester is from week 28 through week 40, months 7 through 9. The third trimester is a time when the unborn baby (fetus) is growing rapidly.  During the third trimester, your discomfort may increase as you and your baby continue to gain weight. You may have abdominal, leg, and back pain, sleeping problems, and an increased need to urinate.  During the third trimester your breasts will keep growing and they will continue to become tender. A yellow fluid (colostrum) may leak from your breasts. This is the first milk you are producing for your baby.  False labor is a condition in which you feel small, irregular tightenings of the muscles in the womb (contractions) that eventually go away. These are called Braxton Hicks contractions. Contractions may last for hours, days, or even weeks before true labor sets in.  Signs of labor can include: abdominal cramps; regular contractions that start at 10 minutes apart and become stronger and more frequent with time; watery or bloody mucus discharge that comes from the vagina; increased pelvic pressure and dull back pain; and leaking of amniotic fluid. This information is not intended to replace advice given to you by your  health care provider. Make sure you discuss any questions you have with your health care provider. Document Revised: 10/12/2018 Document Reviewed: 07/27/2016 [employer]vier Patient Education  2020 [employer]  

## 2019-07-10 LAB — CBC
Hematocrit: 34.5 % (ref 34.0–46.6)
Hemoglobin: 11.6 g/dL (ref 11.1–15.9)
MCH: 28.9 pg (ref 26.6–33.0)
MCHC: 33.6 g/dL (ref 31.5–35.7)
MCV: 86 fL (ref 79–97)
Platelets: 219 10*3/uL (ref 150–450)
RBC: 4.02 x10E6/uL (ref 3.77–5.28)
RDW: 12.4 % (ref 11.7–15.4)
WBC: 7.3 10*3/uL (ref 3.4–10.8)

## 2019-07-10 LAB — GLUCOSE TOLERANCE, 2 HOURS W/ 1HR
Glucose, 1 hour: 98 mg/dL (ref 65–179)
Glucose, 2 hour: 112 mg/dL (ref 65–152)
Glucose, Fasting: 79 mg/dL (ref 65–91)

## 2019-07-10 LAB — RPR: RPR Ser Ql: NONREACTIVE

## 2019-07-10 LAB — HIV ANTIBODY (ROUTINE TESTING W REFLEX): HIV Screen 4th Generation wRfx: NONREACTIVE

## 2019-07-24 ENCOUNTER — Other Ambulatory Visit: Payer: Self-pay

## 2019-07-24 ENCOUNTER — Ambulatory Visit (INDEPENDENT_AMBULATORY_CARE_PROVIDER_SITE_OTHER): Payer: Medicaid Other | Admitting: Advanced Practice Midwife

## 2019-07-24 ENCOUNTER — Ambulatory Visit (HOSPITAL_BASED_OUTPATIENT_CLINIC_OR_DEPARTMENT_OTHER)
Admission: RE | Admit: 2019-07-24 | Discharge: 2019-07-24 | Disposition: A | Discharge status: Home / Self Care | Payer: Medicaid Other | Source: Ambulatory Visit | Attending: Obstetrics & Gynecology | Admitting: Obstetrics & Gynecology

## 2019-07-24 ENCOUNTER — Encounter: Payer: Self-pay | Admitting: Advanced Practice Midwife

## 2019-07-24 VITALS — BP 115/78 | HR 82 | Wt 169.0 lb

## 2019-07-24 DIAGNOSIS — R0609 Other forms of dyspnea: Secondary | ICD-10-CM

## 2019-07-24 DIAGNOSIS — O09299 Supervision of pregnancy with other poor reproductive or obstetric history, unspecified trimester: Secondary | ICD-10-CM | POA: Diagnosis not present

## 2019-07-24 DIAGNOSIS — O0990 Supervision of high risk pregnancy, unspecified, unspecified trimester: Secondary | ICD-10-CM | POA: Diagnosis not present

## 2019-07-24 DIAGNOSIS — R05 Cough: Secondary | ICD-10-CM

## 2019-07-24 DIAGNOSIS — R059 Cough, unspecified: Secondary | ICD-10-CM

## 2019-07-24 DIAGNOSIS — R0600 Dyspnea, unspecified: Secondary | ICD-10-CM

## 2019-07-24 DIAGNOSIS — Z3A31 31 weeks gestation of pregnancy: Secondary | ICD-10-CM

## 2019-07-24 DIAGNOSIS — O0993 Supervision of high risk pregnancy, unspecified, third trimester: Secondary | ICD-10-CM

## 2019-07-24 MED ORDER — GUAIFENESIN ER 600 MG PO TB12: 600.0000 mg | ORAL_TABLET | Freq: Two times a day (BID) | ORAL | 1 refills | Status: DC

## 2019-07-24 NOTE — Patient Instructions (Signed)
Third Trimester of Pregnancy  The third trimester is from week 28 through week 40 (months 7 through 9). This trimester is when your unborn baby (fetus) is growing very fast. At the end of the ninth month, the unborn baby is about 20 inches in length. It weighs about 6-10 pounds. Follow these instructions at home: Medicines  Take over-the-counter and prescription medicines only as told by your doctor. Some medicines are safe and some medicines are not safe during pregnancy.  Take a prenatal vitamin that contains at least 600 micrograms (mcg) of folic acid.  If you have trouble pooping (constipation), take medicine that will make your stool soft (stool softener) if your doctor approves. Eating and drinking   Eat regular, healthy meals.  Avoid raw meat and uncooked cheese.  If you get low calcium from the food you eat, talk to your doctor about taking a daily calcium supplement.  Eat four or five small meals rather than three large meals a day.  Avoid foods that are high in fat and sugars, such as fried and sweet foods.  To prevent constipation: ? Eat foods that are high in fiber, like fresh fruits and vegetables, whole grains, and beans. ? Drink enough fluids to keep your pee (urine) clear or pale yellow. Activity  Exercise only as told by your doctor. Stop exercising if you start to have cramps.  Avoid heavy lifting, wear low heels, and sit up straight.  Do not exercise if it is too hot, too humid, or if you are in a place of great height (high altitude).  You may continue to have sex unless your doctor tells you not to. Relieving pain and discomfort  Wear a good support bra if your breasts are tender.  Take frequent breaks and rest with your legs raised if you have leg cramps or low back pain.  Take warm water baths (sitz baths) to soothe pain or discomfort caused by hemorrhoids. Use hemorrhoid cream if your doctor approves.  If you develop puffy, bulging veins (varicose  veins) in your legs: ? Wear support hose or compression stockings as told by your doctor. ? Raise (elevate) your feet for 15 minutes, 3-4 times a day. ? Limit salt in your food. Safety  Wear your seat belt when driving.  Make a list of emergency phone numbers, including numbers for family, friends, the hospital, and police and fire departments. Preparing for your baby's arrival To prepare for the arrival of your baby:  Take prenatal classes.  Practice driving to the hospital.  Visit the hospital and tour the maternity area.  Talk to your work about taking leave once the baby comes.  Pack your hospital bag.  Prepare the baby's room.  Go to your doctor visits.  Buy a rear-facing car seat. Learn how to install it in your car. General instructions  Do not use hot tubs, steam rooms, or saunas.  Do not use any products that contain nicotine or tobacco, such as cigarettes and e-cigarettes. If you need help quitting, ask your doctor.  Do not drink alcohol.  Do not douche or use tampons or scented sanitary pads.  Do not cross your legs for long periods of time.  Do not travel for long distances unless you must. Only do so if your doctor says it is okay.  Visit your dentist if you have not gone during your pregnancy. Use a soft toothbrush to brush your teeth. Be gentle when you floss.  Avoid cat litter boxes and soil   used by cats. These carry germs that can cause birth defects in the baby and can cause a loss of your baby (miscarriage) or stillbirth.  Keep all your prenatal visits as told by your doctor. This is important. Contact a doctor if:  You are not sure if you are in labor or if your water has broken.  You are dizzy.  You have mild cramps or pressure in your lower belly.  You have a nagging pain in your belly area.  You continue to feel sick to your stomach, you throw up, or you have watery poop.  You have bad smelling fluid coming from your vagina.  You have  pain when you pee. Get help right away if:  You have a fever.  You are leaking fluid from your vagina.  You are spotting or bleeding from your vagina.  You have severe belly cramps or pain.  You lose or gain weight quickly.  You have trouble catching your breath and have chest pain.  You notice sudden or extreme puffiness (swelling) of your face, hands, ankles, feet, or legs.  You have not felt the baby move in over an hour.  You have severe headaches that do not go away with medicine.  You have trouble seeing.  You are leaking, or you are having a gush of fluid, from your vagina before you are 37 weeks.  You have regular belly spasms (contractions) before you are 37 weeks. Summary  The third trimester is from week 28 through week 40 (months 7 through 9). This time is when your unborn baby is growing very fast.  Follow your doctor's advice about medicine, food, and activity.  Get ready for the arrival of your baby by taking prenatal classes, getting all the baby items ready, preparing the baby's room, and visiting your doctor to be checked.  Get help right away if you are bleeding from your vagina, or you have chest pain and trouble catching your breath, or if you have not felt your baby move in over an hour. This information is not intended to replace advice given to you by your health care provider. Make sure you discuss any questions you have with your health care provider. Document Revised: 10/12/2018 Document Reviewed: 07/27/2016 Elsevier Patient Education  2020 Elsevier [employer]. Cough, Adult Coughing is a reflex that clears your throat and your airways (respiratory system). Coughing helps to heal and protect your lungs. It is normal to cough occasionally, but a cough that happens with other symptoms or lasts a long time may be a sign of a condition that needs treatment. An acute cough may only last 2-3 weeks, while a chronic cough may last 8 or more weeks. Coughing is  commonly caused by:  Infection of the respiratory systemby viruses or bacteria.  Breathing in substances that irritate your lungs.  Allergies.  Asthma.  Mucus that runs down the back of your throat (postnasal drip).  Smoking.  Acid backing up from the stomach into the esophagus (gastroesophageal reflux).  Certain medicines.  Chronic lung problems.  Other medical conditions such as heart failure or a blood clot in the lung (pulmonary embolism). Follow these instructions at home: Medicines  Take over-the-counter and prescription medicines only as told by your health care provider.  Talk with your health care provider before you take a cough suppressant medicine. Lifestyle   Avoid cigarette smoke. Do not use any products that contain nicotine or tobacco, such as cigarettes, e-cigarettes, and chewing tobacco. If you need  help quitting, ask your health care provider.  Drink enough fluid to keep your urine pale yellow.  Avoid caffeine.  Do not drink alcohol if your health care provider tells you not to drink. General instructions   Pay close attention to changes in your cough. Tell your health care provider about them.  Always cover your mouth when you cough.  Avoid things that make you cough, such as perfume, candles, cleaning products, or campfire or tobacco smoke.  If the air is dry, use a cool mist vaporizer or humidifier in your bedroom or your home to help loosen secretions.  If your cough is worse at night, try to sleep in a semi-upright position.  Rest as needed.  Keep all follow-up visits as told by your health care provider. This is important. Contact a health care provider if you:  Have new symptoms.  Cough up pus.  Have a cough that does not get better after 2-3 weeks or gets worse.  Cannot control your cough with cough suppressant medicines and you are losing sleep.  Have pain that gets worse or pain that is not helped with medicine.  Have a  fever.  Have unexplained weight loss.  Have night sweats. Get help right away if:  You cough up blood.  You have difficulty breathing.  Your heartbeat is very fast. These symptoms may represent a serious problem that is an emergency. Do not wait to see if the symptoms will go away. Get medical help right away. Call your local emergency services (911 in the [place] Do not drive yourself to the hospital. Summary  Coughing is a reflex that clears your throat and your airways. It is normal to cough occasionally, but a cough that happens with other symptoms or lasts a long time may be a sign of a condition that needs treatment.  Take over-the-counter and prescription medicines only as told by your health care provider.  Always cover your mouth when you cough.  Contact a health care provider if you have new symptoms or a cough that does not get better after 2-3 weeks or gets worse. This information is not intended to replace advice given to you by your health care provider. Make sure you discuss any questions you have with your health care provider. Document Revised: 07/10/2018 Document Reviewed: 07/10/2018 Elsevier Patient Education  [place].

## 2019-07-24 NOTE — Progress Notes (Signed)
  Echocardiogram 2D Echocardiogram has been performed.  Cynthia Wong 07/24/2019, 10:27 AM

## 2019-07-24 NOTE — Progress Notes (Signed)
   PRENATAL VISIT NOTE  Subjective:  Cynthia Wong is a 34 y.o. [id number] at [redacted]w[redacted]d being seen today for ongoing prenatal care.  She is currently monitored for the following issues for this high-risk pregnancy and has Supervision of high risk pregnancy, antepartum; History of stillbirth in currently pregnant patient, unspecified trimester; and Alpha thalassemia silent carrier on their problem list.  Patient reports persistent cough, states better with recent inhalers,  has had two neg Covid tests.  States it does get better with inhalers but comes back.  Has not been taking Mucinex.  States cough a little productive now.   Contractions: Irritability. Vag. Bleeding: None.  Movement: Present. Denies leaking of fluid.   The following portions of the patient's history were reviewed and updated as appropriate: allergies, current medications, past family history, past medical history, past social history, past surgical history and problem list.   Objective:   Vitals:   07/24/19 1058  BP: 115/78  Pulse: 82  Weight: 169 lb (76.7 kg)    Fetal Status: Fetal Heart Rate (bpm): 140   Movement: Present     General:  Alert, oriented and cooperative. Patient is in no acute distress.  Skin: Skin is warm and dry. No rash noted.   Cardiovascular: Normal heart rate noted  Respiratory: Normal respiratory effort, no problems with respiration noted  Abdomen: Soft, gravid, appropriate for gestational age.  Pain/Pressure: Present     Pelvic: Cervical exam deferred        Extremities: Normal range of motion.  Edema: None  Mental Status: Normal mood and affect. Normal behavior. Normal judgment and thought content.   Assessment and Plan:  Pregnancy: A6T0160 at [redacted]w[redacted]d  High Risk Pregnancy Preterm labor symptoms and general obstetric precautions including but not limited to vaginal bleeding, contractions, leaking of fluid and fetal movement were reviewed in detail with the patient. Hx Stillbirth Growth US's  as scheduled Cough Will add Rx for Mucinex to try.  Continue previous therapies. Refer to Pulm if needed if not better Alpha Thalassemia Carrier   Please refer to After Visit Summary for other counseling recommendations.   Return in about 2 weeks (around 08/07/2019) for [facility].  Future Appointments  Date Time Provider Department Center  07/25/2019  3:15 PM [facility] NURSE [facility]-M[facility] M[facility]-US  07/25/2019  3:15 PM [facility]-M[facility] [place] 4 WH-MFCUS MFC-US  08/01/2019  3:00 PM [facility] NURSE [facility]-MFC MFC-US  08/01/2019  3:00 PM [facility]-MFC [place] 3 WH-MFCUS MFC-US  08/09/2019 10:45 AM Stinson, Rhona Raider, DO [facility] None    Wynelle Bourgeois, CNM

## 2019-07-25 ENCOUNTER — Other Ambulatory Visit (HOSPITAL_COMMUNITY): Payer: Self-pay | Admitting: *Deleted

## 2019-07-25 ENCOUNTER — Ambulatory Visit (HOSPITAL_COMMUNITY): Payer: Medicaid Other | Admitting: *Deleted

## 2019-07-25 ENCOUNTER — Ambulatory Visit (HOSPITAL_COMMUNITY)
Admission: RE | Admit: 2019-07-25 | Discharge: 2019-07-25 | Disposition: A | Discharge status: Home / Self Care | Payer: Medicaid Other | Source: Ambulatory Visit | Attending: Obstetrics and Gynecology | Admitting: Obstetrics and Gynecology

## 2019-07-25 ENCOUNTER — Encounter (HOSPITAL_COMMUNITY): Payer: Self-pay

## 2019-07-25 DIAGNOSIS — Z3A31 31 weeks gestation of pregnancy: Secondary | ICD-10-CM | POA: Diagnosis not present

## 2019-07-25 DIAGNOSIS — O09293 Supervision of pregnancy with other poor reproductive or obstetric history, third trimester: Secondary | ICD-10-CM

## 2019-07-25 DIAGNOSIS — R0600 Dyspnea, unspecified: Secondary | ICD-10-CM | POA: Insufficient documentation

## 2019-07-25 DIAGNOSIS — Z8759 Personal history of other complications of pregnancy, childbirth and the puerperium: Secondary | ICD-10-CM | POA: Insufficient documentation

## 2019-07-25 DIAGNOSIS — D563 Thalassemia minor: Secondary | ICD-10-CM | POA: Diagnosis not present

## 2019-08-01 ENCOUNTER — Ambulatory Visit (HOSPITAL_COMMUNITY): Payer: Medicaid Other | Admitting: *Deleted

## 2019-08-01 ENCOUNTER — Other Ambulatory Visit: Payer: Self-pay

## 2019-08-01 ENCOUNTER — Encounter (HOSPITAL_COMMUNITY): Payer: Self-pay

## 2019-08-01 ENCOUNTER — Ambulatory Visit (HOSPITAL_COMMUNITY)
Admission: RE | Admit: 2019-08-01 | Discharge: 2019-08-01 | Disposition: A | Discharge status: Home / Self Care | Payer: Medicaid Other | Source: Ambulatory Visit | Attending: Obstetrics and Gynecology | Admitting: Obstetrics and Gynecology

## 2019-08-01 DIAGNOSIS — Z362 Encounter for other antenatal screening follow-up: Secondary | ICD-10-CM

## 2019-08-01 DIAGNOSIS — D563 Thalassemia minor: Secondary | ICD-10-CM

## 2019-08-01 DIAGNOSIS — R0600 Dyspnea, unspecified: Secondary | ICD-10-CM | POA: Diagnosis not present

## 2019-08-01 DIAGNOSIS — R0609 Other forms of dyspnea: Secondary | ICD-10-CM

## 2019-08-01 DIAGNOSIS — O09293 Supervision of pregnancy with other poor reproductive or obstetric history, third trimester: Secondary | ICD-10-CM | POA: Diagnosis not present

## 2019-08-01 DIAGNOSIS — Z8759 Personal history of other complications of pregnancy, childbirth and the puerperium: Secondary | ICD-10-CM | POA: Insufficient documentation

## 2019-08-01 DIAGNOSIS — Z3A32 32 weeks gestation of pregnancy: Secondary | ICD-10-CM

## 2019-08-07 ENCOUNTER — Ambulatory Visit (HOSPITAL_COMMUNITY)
Admission: RE | Admit: 2019-08-07 | Discharge: 2019-08-07 | Disposition: A | Discharge status: Home / Self Care | Payer: Medicaid Other | Source: Ambulatory Visit | Attending: Obstetrics and Gynecology | Admitting: Obstetrics and Gynecology

## 2019-08-07 ENCOUNTER — Encounter (HOSPITAL_COMMUNITY): Payer: Self-pay | Admitting: *Deleted

## 2019-08-07 ENCOUNTER — Ambulatory Visit (HOSPITAL_COMMUNITY): Payer: Medicaid Other | Admitting: *Deleted

## 2019-08-07 ENCOUNTER — Other Ambulatory Visit: Payer: Self-pay

## 2019-08-07 DIAGNOSIS — D563 Thalassemia minor: Secondary | ICD-10-CM | POA: Insufficient documentation

## 2019-08-07 DIAGNOSIS — Z8759 Personal history of other complications of pregnancy, childbirth and the puerperium: Secondary | ICD-10-CM | POA: Diagnosis not present

## 2019-08-07 DIAGNOSIS — O09293 Supervision of pregnancy with other poor reproductive or obstetric history, third trimester: Secondary | ICD-10-CM

## 2019-08-07 DIAGNOSIS — Z3A33 33 weeks gestation of pregnancy: Secondary | ICD-10-CM | POA: Diagnosis not present

## 2019-08-09 ENCOUNTER — Encounter: Payer: Medicaid Other | Admitting: Family Medicine

## 2019-08-14 ENCOUNTER — Other Ambulatory Visit: Payer: Self-pay

## 2019-08-14 ENCOUNTER — Ambulatory Visit (HOSPITAL_COMMUNITY): Payer: Medicaid Other | Admitting: *Deleted

## 2019-08-14 ENCOUNTER — Encounter (HOSPITAL_COMMUNITY): Payer: Self-pay

## 2019-08-14 ENCOUNTER — Other Ambulatory Visit (HOSPITAL_COMMUNITY): Payer: Self-pay | Admitting: *Deleted

## 2019-08-14 ENCOUNTER — Ambulatory Visit (HOSPITAL_COMMUNITY)
Admission: RE | Admit: 2019-08-14 | Discharge: 2019-08-14 | Disposition: A | Discharge status: Home / Self Care | Payer: Medicaid Other | Source: Ambulatory Visit | Attending: Obstetrics and Gynecology | Admitting: Obstetrics and Gynecology

## 2019-08-14 VITALS — BP 111/78 | HR 108 | Temp 97.1°F

## 2019-08-14 DIAGNOSIS — Z8759 Personal history of other complications of pregnancy, childbirth and the puerperium: Secondary | ICD-10-CM | POA: Insufficient documentation

## 2019-08-14 DIAGNOSIS — Z3A34 34 weeks gestation of pregnancy: Secondary | ICD-10-CM

## 2019-08-14 DIAGNOSIS — O09293 Supervision of pregnancy with other poor reproductive or obstetric history, third trimester: Secondary | ICD-10-CM | POA: Diagnosis not present

## 2019-08-14 DIAGNOSIS — O0990 Supervision of high risk pregnancy, unspecified, unspecified trimester: Secondary | ICD-10-CM | POA: Insufficient documentation

## 2019-08-14 DIAGNOSIS — D563 Thalassemia minor: Secondary | ICD-10-CM

## 2019-08-17 ENCOUNTER — Encounter: Payer: Medicaid Other | Admitting: Family Medicine

## 2019-08-21 ENCOUNTER — Encounter (HOSPITAL_COMMUNITY): Payer: Self-pay | Admitting: *Deleted

## 2019-08-21 ENCOUNTER — Ambulatory Visit (HOSPITAL_COMMUNITY): Payer: Medicaid Other | Admitting: *Deleted

## 2019-08-21 ENCOUNTER — Other Ambulatory Visit: Payer: Self-pay

## 2019-08-21 ENCOUNTER — Ambulatory Visit (HOSPITAL_COMMUNITY)
Admission: RE | Admit: 2019-08-21 | Discharge: 2019-08-21 | Disposition: A | Discharge status: Home / Self Care | Payer: Medicaid Other | Source: Ambulatory Visit | Attending: Obstetrics and Gynecology | Admitting: Obstetrics and Gynecology

## 2019-08-21 DIAGNOSIS — O09293 Supervision of pregnancy with other poor reproductive or obstetric history, third trimester: Secondary | ICD-10-CM

## 2019-08-21 DIAGNOSIS — D563 Thalassemia minor: Secondary | ICD-10-CM

## 2019-08-21 DIAGNOSIS — Z8759 Personal history of other complications of pregnancy, childbirth and the puerperium: Secondary | ICD-10-CM | POA: Diagnosis not present

## 2019-08-21 DIAGNOSIS — Z3A35 35 weeks gestation of pregnancy: Secondary | ICD-10-CM

## 2019-08-28 ENCOUNTER — Encounter (HOSPITAL_COMMUNITY): Payer: Self-pay | Admitting: *Deleted

## 2019-08-28 ENCOUNTER — Ambulatory Visit (HOSPITAL_COMMUNITY)
Admission: RE | Admit: 2019-08-28 | Discharge: 2019-08-28 | Disposition: A | Discharge status: Home / Self Care | Payer: Medicaid Other | Source: Ambulatory Visit | Attending: Obstetrics and Gynecology | Admitting: Obstetrics and Gynecology

## 2019-08-28 ENCOUNTER — Other Ambulatory Visit (HOSPITAL_COMMUNITY): Payer: Self-pay | Admitting: *Deleted

## 2019-08-28 ENCOUNTER — Other Ambulatory Visit: Payer: Self-pay

## 2019-08-28 ENCOUNTER — Ambulatory Visit (HOSPITAL_COMMUNITY): Payer: Medicaid Other | Admitting: *Deleted

## 2019-08-28 DIAGNOSIS — Z8759 Personal history of other complications of pregnancy, childbirth and the puerperium: Secondary | ICD-10-CM | POA: Insufficient documentation

## 2019-08-28 DIAGNOSIS — D563 Thalassemia minor: Secondary | ICD-10-CM

## 2019-08-28 DIAGNOSIS — Z362 Encounter for other antenatal screening follow-up: Secondary | ICD-10-CM

## 2019-08-28 DIAGNOSIS — O09293 Supervision of pregnancy with other poor reproductive or obstetric history, third trimester: Secondary | ICD-10-CM

## 2019-08-28 DIAGNOSIS — Z3A36 36 weeks gestation of pregnancy: Secondary | ICD-10-CM

## 2019-08-31 ENCOUNTER — Ambulatory Visit (INDEPENDENT_AMBULATORY_CARE_PROVIDER_SITE_OTHER): Payer: Medicaid Other | Admitting: Family Medicine

## 2019-08-31 ENCOUNTER — Other Ambulatory Visit: Payer: Self-pay

## 2019-08-31 ENCOUNTER — Other Ambulatory Visit (HOSPITAL_COMMUNITY)
Admission: RE | Admit: 2019-08-31 | Discharge: 2019-08-31 | Disposition: A | Discharge status: Home / Self Care | Payer: Medicaid Other | Source: Ambulatory Visit | Attending: Family Medicine | Admitting: Family Medicine

## 2019-08-31 VITALS — BP 129/77 | HR 88 | Wt 175.0 lb

## 2019-08-31 DIAGNOSIS — O0990 Supervision of high risk pregnancy, unspecified, unspecified trimester: Secondary | ICD-10-CM | POA: Diagnosis not present

## 2019-08-31 DIAGNOSIS — Z3A36 36 weeks gestation of pregnancy: Secondary | ICD-10-CM

## 2019-08-31 DIAGNOSIS — O09299 Supervision of pregnancy with other poor reproductive or obstetric history, unspecified trimester: Secondary | ICD-10-CM

## 2019-08-31 DIAGNOSIS — K649 Unspecified hemorrhoids: Secondary | ICD-10-CM

## 2019-08-31 DIAGNOSIS — O0993 Supervision of high risk pregnancy, unspecified, third trimester: Secondary | ICD-10-CM

## 2019-08-31 DIAGNOSIS — D563 Thalassemia minor: Secondary | ICD-10-CM

## 2019-08-31 DIAGNOSIS — O09293 Supervision of pregnancy with other poor reproductive or obstetric history, third trimester: Secondary | ICD-10-CM

## 2019-08-31 NOTE — Progress Notes (Signed)
   PRENATAL VISIT NOTE  Subjective:  Cynthia Wong is a 34 y.o. [id number] at [redacted]w[redacted]d being seen today for ongoing prenatal care.  She is currently monitored for the following issues for this high-risk pregnancy and has Supervision of high risk pregnancy, antepartum; History of stillbirth in currently pregnant patient, unspecified trimester; Alpha thalassemia silent carrier; and Dyspnea on their problem list.  Patient reports hemorrhoids.  Contractions: Irregular. Vag. Bleeding: None.  Movement: Present. Denies leaking of fluid.   The following portions of the patient's history were reviewed and updated as appropriate: allergies, current medications, past family history, past medical history, past social history, past surgical history and problem list.   Objective:   Vitals:   08/31/19 1049  BP: 129/77  Pulse: 88  Weight: 175 lb (79.4 kg)    Fetal Status: Fetal Heart Rate (bpm): 150 Fundal Height: 36 cm Movement: Present  Presentation: Vertex  General:  Alert, oriented and cooperative. Patient is in no acute distress.  Skin: Skin is warm and dry. No rash noted.   Cardiovascular: Normal heart rate noted  Respiratory: Normal respiratory effort, no problems with respiration noted  Abdomen: Soft, gravid, appropriate for gestational age.  Pain/Pressure: Present     Pelvic: Cervical exam deferred        Extremities: Normal range of motion.  Edema: Trace  Mental Status: Normal mood and affect. Normal behavior. Normal judgment and thought content.   Assessment and Plan:  Pregnancy: L3T3428 at [redacted]w[redacted]d 1. Supervision of high risk pregnancy, antepartum FHT and FH normal - Culture, beta strep (group b only) - GC/Chlamydia probe amp ()not at [facility]  2. History of stillbirth in currently pregnant patient, unspecified trimester Delivery between 38-39 weeks (MFM note states delivery at 38 weeks not unreasonable - 08/28/19).  BPP 8/8  3. Alpha thalassemia silent carrier  4.  Hemorrhoids, unspecified hemorrhoid type Preparation H 2-3x per day. No evidence of thrombosis  Preterm labor symptoms and general obstetric precautions including but not limited to vaginal bleeding, contractions, leaking of fluid and fetal movement were reviewed in detail with the patient. Please refer to After Visit Summary for other counseling recommendations.   Return in about 1 week (around 09/07/2019) for OB f/u, In Office.  Future Appointments  Date Time Provider Department Center  09/04/2019  8:50 AM Aviva Signs, CNM [facility] None  09/04/2019 11:15 AM [facility] NURSE WH-MFC MFC-US  09/04/2019 11:15 AM [facility] [place] 4 WH-MFCUS MFC-US  09/11/2019 11:30 AM [facility] NURSE WH-MFC MFC-US  09/11/2019 11:30 AM [facility] [place] 5 WH-MFCUS MFC-US    Levie Heritage, DO

## 2019-09-03 ENCOUNTER — Encounter: Payer: Self-pay | Admitting: Family Medicine

## 2019-09-03 DIAGNOSIS — O99820 Streptococcus B carrier state complicating pregnancy: Secondary | ICD-10-CM | POA: Insufficient documentation

## 2019-09-03 LAB — CULTURE, BETA STREP (GROUP B ONLY): Strep Gp B Culture: POSITIVE — AB

## 2019-09-03 LAB — GC/CHLAMYDIA PROBE AMP (~~LOC~~) NOT AT ARMC
Chlamydia: NEGATIVE
Comment: NEGATIVE
Comment: NORMAL
Neisseria Gonorrhea: NEGATIVE

## 2019-09-04 ENCOUNTER — Encounter: Payer: Self-pay | Admitting: Advanced Practice Midwife

## 2019-09-04 ENCOUNTER — Ambulatory Visit (INDEPENDENT_AMBULATORY_CARE_PROVIDER_SITE_OTHER): Payer: Medicaid Other | Admitting: Advanced Practice Midwife

## 2019-09-04 ENCOUNTER — Encounter (HOSPITAL_COMMUNITY): Payer: Self-pay | Admitting: Family Medicine

## 2019-09-04 ENCOUNTER — Other Ambulatory Visit: Payer: Self-pay

## 2019-09-04 ENCOUNTER — Other Ambulatory Visit: Payer: Self-pay | Admitting: Advanced Practice Midwife

## 2019-09-04 ENCOUNTER — Inpatient Hospital Stay (HOSPITAL_COMMUNITY)
Admission: AD | Admit: 2019-09-04 | Discharge: 2019-09-06 | DRG: 807 | Disposition: A | Discharge status: Home / Self Care | Payer: Medicaid Other | Attending: Family Medicine | Admitting: Family Medicine

## 2019-09-04 ENCOUNTER — Ambulatory Visit (HOSPITAL_COMMUNITY): Payer: Medicaid Other

## 2019-09-04 VITALS — BP 123/83 | HR 84 | Wt 175.0 lb

## 2019-09-04 DIAGNOSIS — O99824 Streptococcus B carrier state complicating childbirth: Secondary | ICD-10-CM | POA: Diagnosis not present

## 2019-09-04 DIAGNOSIS — O26893 Other specified pregnancy related conditions, third trimester: Secondary | ICD-10-CM | POA: Diagnosis present

## 2019-09-04 DIAGNOSIS — O4292 Full-term premature rupture of membranes, unspecified as to length of time between rupture and onset of labor: Secondary | ICD-10-CM

## 2019-09-04 DIAGNOSIS — Z20822 Contact with and (suspected) exposure to covid-19: Secondary | ICD-10-CM | POA: Diagnosis present

## 2019-09-04 DIAGNOSIS — Z3A37 37 weeks gestation of pregnancy: Secondary | ICD-10-CM | POA: Diagnosis not present

## 2019-09-04 DIAGNOSIS — J45909 Unspecified asthma, uncomplicated: Secondary | ICD-10-CM | POA: Diagnosis present

## 2019-09-04 DIAGNOSIS — O9952 Diseases of the respiratory system complicating childbirth: Secondary | ICD-10-CM | POA: Diagnosis present

## 2019-09-04 DIAGNOSIS — O99820 Streptococcus B carrier state complicating pregnancy: Secondary | ICD-10-CM

## 2019-09-04 DIAGNOSIS — O09299 Supervision of pregnancy with other poor reproductive or obstetric history, unspecified trimester: Secondary | ICD-10-CM

## 2019-09-04 DIAGNOSIS — D563 Thalassemia minor: Secondary | ICD-10-CM | POA: Diagnosis present

## 2019-09-04 DIAGNOSIS — O479 False labor, unspecified: Secondary | ICD-10-CM | POA: Diagnosis not present

## 2019-09-04 DIAGNOSIS — O4290 Premature rupture of membranes, unspecified as to length of time between rupture and onset of labor, unspecified weeks of gestation: Secondary | ICD-10-CM | POA: Diagnosis present

## 2019-09-04 DIAGNOSIS — O0990 Supervision of high risk pregnancy, unspecified, unspecified trimester: Secondary | ICD-10-CM

## 2019-09-04 DIAGNOSIS — R0600 Dyspnea, unspecified: Secondary | ICD-10-CM

## 2019-09-04 HISTORY — DX: Unspecified hemorrhoids: K64.9

## 2019-09-04 LAB — RESPIRATORY PANEL BY RT PCR (FLU A&B, COVID)
Influenza A by PCR: NEGATIVE
Influenza B by PCR: NEGATIVE
SARS Coronavirus 2 by RT PCR: NEGATIVE

## 2019-09-04 LAB — CBC
HCT: 39.4 % (ref 36.0–46.0)
Hemoglobin: 12.5 g/dL (ref 12.0–15.0)
MCH: 27.2 pg (ref 26.0–34.0)
MCHC: 31.7 g/dL (ref 30.0–36.0)
MCV: 85.8 fL (ref 80.0–100.0)
Platelets: 211 10*3/uL (ref 150–400)
RBC: 4.59 MIL/uL (ref 3.87–5.11)
RDW: 13.7 % (ref 11.5–15.5)
WBC: 7.9 10*3/uL (ref 4.0–10.5)
nRBC: 0 % (ref 0.0–0.2)

## 2019-09-04 LAB — TYPE AND SCREEN
ABO/RH(D): O POS
Antibody Screen: NEGATIVE

## 2019-09-04 LAB — ABO/RH: ABO/RH(D): O POS

## 2019-09-04 MED ORDER — OXYTOCIN 40 UNITS IN NORMAL SALINE INFUSION - SIMPLE MED
2.5000 [IU]/h | INTRAVENOUS | Status: DC
  Filled 2019-09-04: qty 1000

## 2019-09-04 MED ORDER — PHENYLEPHRINE 40 MCG/ML (10ML) SYRINGE FOR IV PUSH (FOR BLOOD PRESSURE SUPPORT): 80.0000 ug | PREFILLED_SYRINGE | INTRAVENOUS | Status: DC | PRN

## 2019-09-04 MED ORDER — LACTATED RINGERS IV SOLN: INTRAVENOUS | Status: DC

## 2019-09-04 MED ORDER — ONDANSETRON HCL 4 MG/2ML IJ SOLN: 4.0000 mg | INTRAMUSCULAR | Status: DC | PRN

## 2019-09-04 MED ORDER — COCONUT OIL OIL: 1.0000 "application " | TOPICAL_OIL | Status: DC | PRN

## 2019-09-04 MED ORDER — TETANUS-DIPHTH-ACELL PERTUSSIS 5-2.5-18.5 LF-MCG/0.5 IM SUSP: 0.5000 mL | Freq: Once | INTRAMUSCULAR | Status: DC

## 2019-09-04 MED ORDER — PENICILLIN G POT IN DEXTROSE 60000 UNIT/ML IV SOLN: 3.0000 10*6.[IU] | INTRAVENOUS | Status: DC

## 2019-09-04 MED ORDER — OXYCODONE HCL 5 MG PO TABS
5.0000 mg | ORAL_TABLET | ORAL | Status: DC | PRN
  Administered 2019-09-05: 5 mg via ORAL
  Filled 2019-09-04 (×2): qty 1

## 2019-09-04 MED ORDER — ALBUTEROL SULFATE (2.5 MG/3ML) 0.083% IN NEBU: 3.0000 mL | INHALATION_SOLUTION | RESPIRATORY_TRACT | Status: DC | PRN

## 2019-09-04 MED ORDER — PRENATAL MULTIVITAMIN CH
1.0000 | ORAL_TABLET | Freq: Every day | ORAL | Status: DC
  Administered 2019-09-05 – 2019-09-06 (×2): 1 via ORAL
  Filled 2019-09-04 (×2): qty 1

## 2019-09-04 MED ORDER — SODIUM CHLORIDE 0.9 % IV SOLN
5.0000 10*6.[IU] | Freq: Once | INTRAVENOUS | Status: AC
  Administered 2019-09-04: 5 10*6.[IU] via INTRAVENOUS
  Filled 2019-09-04: qty 5

## 2019-09-04 MED ORDER — WITCH HAZEL-GLYCERIN EX PADS: 1.0000 "application " | MEDICATED_PAD | CUTANEOUS | Status: DC | PRN

## 2019-09-04 MED ORDER — FENTANYL CITRATE (PF) 100 MCG/2ML IJ SOLN
INTRAMUSCULAR | Status: AC
  Filled 2019-09-04: qty 2

## 2019-09-04 MED ORDER — MOMETASONE FURO-FORMOTEROL FUM 100-5 MCG/ACT IN AERO
2.0000 | INHALATION_SPRAY | Freq: Two times a day (BID) | RESPIRATORY_TRACT | Status: DC
  Administered 2019-09-04 – 2019-09-05 (×3): 2 via RESPIRATORY_TRACT
  Filled 2019-09-04: qty 8.8

## 2019-09-04 MED ORDER — OXYCODONE-ACETAMINOPHEN 5-325 MG PO TABS: 2.0000 | ORAL_TABLET | ORAL | Status: DC | PRN

## 2019-09-04 MED ORDER — LIDOCAINE HCL (PF) 1 % IJ SOLN
30.0000 mL | INTRAMUSCULAR | Status: AC | PRN
  Administered 2019-09-04: 30 mL via SUBCUTANEOUS
  Filled 2019-09-04: qty 30

## 2019-09-04 MED ORDER — IBUPROFEN 600 MG PO TABS
600.0000 mg | ORAL_TABLET | Freq: Four times a day (QID) | ORAL | Status: DC
  Administered 2019-09-04 – 2019-09-06 (×9): 600 mg via ORAL
  Filled 2019-09-04 (×9): qty 1

## 2019-09-04 MED ORDER — FENTANYL-BUPIVACAINE-NACL 0.5-0.125-0.9 MG/250ML-% EP SOLN
12.0000 mL/h | EPIDURAL | Status: DC | PRN
  Filled 2019-09-04: qty 250

## 2019-09-04 MED ORDER — OXYCODONE-ACETAMINOPHEN 5-325 MG PO TABS: 1.0000 | ORAL_TABLET | ORAL | Status: DC | PRN

## 2019-09-04 MED ORDER — EPHEDRINE 5 MG/ML INJ: 10.0000 mg | INTRAVENOUS | Status: DC | PRN

## 2019-09-04 MED ORDER — OXYTOCIN BOLUS FROM INFUSION
500.0000 mL | Freq: Once | INTRAVENOUS | Status: AC
  Administered 2019-09-04: 500 mL via INTRAVENOUS

## 2019-09-04 MED ORDER — OXYCODONE HCL 5 MG PO TABS: 10.0000 mg | ORAL_TABLET | ORAL | Status: DC | PRN

## 2019-09-04 MED ORDER — ACETAMINOPHEN 325 MG PO TABS: 650.0000 mg | ORAL_TABLET | ORAL | Status: DC | PRN

## 2019-09-04 MED ORDER — LACTATED RINGERS IV SOLN
500.0000 mL | Freq: Once | INTRAVENOUS | Status: AC
  Administered 2019-09-04: 500 mL via INTRAVENOUS

## 2019-09-04 MED ORDER — ACETAMINOPHEN 325 MG PO TABS
650.0000 mg | ORAL_TABLET | ORAL | Status: DC | PRN
  Administered 2019-09-04: 650 mg via ORAL
  Filled 2019-09-04: qty 2

## 2019-09-04 MED ORDER — ONDANSETRON HCL 4 MG PO TABS: 4.0000 mg | ORAL_TABLET | ORAL | Status: DC | PRN

## 2019-09-04 MED ORDER — DIPHENHYDRAMINE HCL 25 MG PO CAPS: 25.0000 mg | ORAL_CAPSULE | Freq: Four times a day (QID) | ORAL | Status: DC | PRN

## 2019-09-04 MED ORDER — LACTATED RINGERS IV SOLN: 500.0000 mL | INTRAVENOUS | Status: DC | PRN

## 2019-09-04 MED ORDER — FENTANYL CITRATE (PF) 100 MCG/2ML IJ SOLN
100.0000 ug | INTRAMUSCULAR | Status: DC | PRN
  Administered 2019-09-04: 100 ug via INTRAVENOUS

## 2019-09-04 MED ORDER — SIMETHICONE 80 MG PO CHEW: 80.0000 mg | CHEWABLE_TABLET | ORAL | Status: DC | PRN

## 2019-09-04 MED ORDER — PNEUMOCOCCAL VAC POLYVALENT 25 MCG/0.5ML IJ INJ: 0.5000 mL | INJECTION | INTRAMUSCULAR | Status: DC

## 2019-09-04 MED ORDER — SOD CITRATE-CITRIC ACID 500-334 MG/5ML PO SOLN: 30.0000 mL | ORAL | Status: DC | PRN

## 2019-09-04 MED ORDER — DIBUCAINE (PERIANAL) 1 % EX OINT
1.0000 "application " | TOPICAL_OINTMENT | CUTANEOUS | Status: DC | PRN
  Filled 2019-09-04: qty 28

## 2019-09-04 MED ORDER — BENZOCAINE-MENTHOL 20-0.5 % EX AERO
1.0000 "application " | INHALATION_SPRAY | CUTANEOUS | Status: DC | PRN
  Administered 2019-09-04: 1 via TOPICAL
  Filled 2019-09-04: qty 56

## 2019-09-04 MED ORDER — SENNOSIDES-DOCUSATE SODIUM 8.6-50 MG PO TABS
2.0000 | ORAL_TABLET | ORAL | Status: DC
  Administered 2019-09-04 – 2019-09-05 (×2): 2 via ORAL
  Filled 2019-09-04 (×2): qty 2

## 2019-09-04 MED ORDER — ONDANSETRON HCL 4 MG/2ML IJ SOLN: 4.0000 mg | Freq: Four times a day (QID) | INTRAMUSCULAR | Status: DC | PRN

## 2019-09-04 MED ORDER — DIPHENHYDRAMINE HCL 50 MG/ML IJ SOLN: 12.5000 mg | INTRAMUSCULAR | Status: DC | PRN

## 2019-09-04 NOTE — Patient Instructions (Signed)

## 2019-09-04 NOTE — Progress Notes (Addendum)
   PRENATAL VISIT NOTE  Subjective:  Cynthia Wong is a 34 y.o. [phone] at [redacted]w[redacted]d being seen today for ongoing prenatal care.  She is currently monitored for the following issues for this high-risk pregnancy and has Supervision of high risk pregnancy, antepartum; History of stillbirth in currently pregnant patient, unspecified trimester; Alpha thalassemia silent carrier; Dyspnea; and GBS (group B Streptococcus carrier), +RV culture, currently pregnant on their problem list.  Patient reports painful contractions sinice 1am.  Contractions: Irritability. Vag. Bleeding: None.  Movement: Present. Denies leaking of fluid.   The following portions of the patient's history were reviewed and updated as appropriate: allergies, current medications, past family history, past medical history, past social history, past surgical history and problem list.   Objective:   Vitals:   09/04/19 0914  BP: 123/83  Pulse: 84  Weight: 175 lb (79.4 kg)    Fetal Status: Fetal Heart Rate (bpm): 130   Movement: Present     General:  Alert, oriented and cooperative. Patient is in no acute distress.  Skin: Skin is warm and dry. No rash noted.   Cardiovascular: Normal heart rate noted  Respiratory: Normal respiratory effort, no problems with respiration noted  Abdomen: Soft, gravid, appropriate for gestational age.  Pain/Pressure: Present     Pelvic: Cervical exam performed        3-4/90/0/vtx   Clear fluid drainiing from vagina (0935am)  Extremities: Normal range of motion.  Edema: Trace  Mental Status: Normal mood and affect. Normal behavior. Normal judgment and thought content.   Assessment and Plan:  Pregnancy: [id number] at [redacted]w[redacted]d . Term labor symptoms and general obstetric precautions including but not limited to vaginal bleeding, contractions, leaking of fluid and fetal movement were reviewed in detail with the patient. Please refer to After Visit Summary for other counseling recommendations.   RECHECK OF  CERVIX after pt felt pressure at 0955: 4/90/-1/vtx  Future Appointments  Date Time Provider Department Center  09/04/2019 11:15 AM [facility] NURSE WH-MFC MFC-US  09/04/2019 11:15 AM [facility] [place] 4 WH-MFCUS MFC-US  09/11/2019 11:30 AM [facility] NURSE WH-MFC MFC-US  09/11/2019 11:30 AM [facility]-M[facility] [place] 5 WH-MFCUS MFC-US    Wynelle Bourgeois, CNM

## 2019-09-04 NOTE — H&P (Signed)
OBSTETRIC ADMISSION HISTORY AND PHYSICAL  Cynthia Wong is a 34 y.o. female [phone] with IUP at [redacted]w[redacted]d by early u/s presenting for SROM around 0900, and has been having contractions since midnight.  She plans on breast feeding. She is undecided for birth control. She received her prenatal care at [facility]   Dating: By early u/s --->  Estimated Date of Delivery: 09/24/19  Sono:    @[redacted]w[redacted]d , CWD, normal anatomy, cephalic presentation, 2701g, EFW   Prenatal History/Complications: - hx of IUFD @ 28 weeks - asthma  Past Medical History: Past Medical History:  Diagnosis Date  . Bronchitis   . Vaginal Pap smear, abnormal    colposcopy    Past Surgical History: Past Surgical History:  Procedure Laterality Date  . NO PAST SURGERIES      Obstetrical History: OB History    Gravida  6   Para  2   Term  1   Preterm  1   AB  3   Living  1     SAB  1   TAB  2   Ectopic  0   Multiple  0   Live Births  1           Social History Social History   Socioeconomic History  . Marital status: Significant Other    Spouse name: Not on file  . Number of children: Not on file  . Years of education: Not on file  . Highest education level: Not on file  Occupational History  . Not on file  Tobacco Use  . Smoking status: Never Smoker  . Smokeless tobacco: Never Used  Substance and Sexual Activity  . Alcohol use: Not Currently  . Drug use: No  . Sexual activity: Yes    Birth control/protection: None  Other Topics Concern  . Not on file  Social History Narrative  . Not on file   Social Determinants of Health   Financial Resource Strain:   . Difficulty of Paying Living Expenses: Not on file  Food Insecurity:   . Worried About 29% in the Last Year: Not on file  . Ran Out of Food in the Last Year: Not on file  Transportation Needs:   . Lack of Transportation (Medical): Not on file  . Lack of Transportation (Non-Medical): Not on file   Physical Activity:   . Days of Exercise per Week: Not on file  . Minutes of Exercise per Session: Not on file  Stress:   . Feeling of Stress : Not on file  Social Connections:   . Frequency of Communication with Friends and Family: Not on file  . Frequency of Social Gatherings with Friends and Family: Not on file  . Attends Religious Services: Not on file  . Active Member of Clubs or Organizations: Not on file  . Attends Programme researcher, broadcasting/film/video Meetings: Not on file  . Marital Status: Not on file    Family History: Family History  Problem Relation Age of Onset  . Hypertension Mother   . Diabetes Mother   . Asthma Brother   . Cancer Maternal Grandmother        pancreas  . Cancer Paternal Grandmother     Allergies: No Known Allergies  No medications prior to admission.     Review of Systems   All systems reviewed and negative except as stated in HPI  Last menstrual period 12/18/2018. General appearance: alert, cooperative and no distress Lungs: clear to auscultation  bilaterally Heart: regular rate and rhythm Abdomen: gravid Extremities: Homans sign is negative, no sign of DVT      Prenatal labs: ABO, Rh: O/Positive/-- (09/03 1636) Antibody: Negative (09/03 1636) Rubella: 5.67 (09/03 1636) RPR: Non Reactive (01/04 0937)  HBsAg: Negative (09/03 1636)  HIV: Non Reactive (01/04 0937)  GBS: Positive/-- (02/26 1057)  1 hr Glucola 98 Genetic screening  NIPS - low risk, AFP neg, Silent alpha-thal carrier Anatomy US normal  Prenatal Transfer Tool  Maternal Diabetes: No Genetic Screening: Abnormal:  Results: Other:silent alpha thal carrier Maternal Ultrasounds/Referrals: Normal Fetal Ultrasounds or other Referrals:  None Maternal Substance Abuse:  No Significant Maternal Medications:  Meds include: Other: Symbicort, albuterol Significant Maternal Lab Results: Group B Strep positive  No results found for this or any previous visit (from the past 24  hour(s)).  Patient Active Problem List   Diagnosis Date Noted  . GBS (group B Streptococcus carrier), +RV culture, currently pregnant 09/03/2019  . Dyspnea 07/25/2019  . Alpha thalassemia silent carrier 06/04/2019  . Supervision of high risk pregnancy, antepartum 03/08/2019  . History of stillbirth in currently pregnant patient, unspecified trimester 03/08/2019    Assessment/Plan:  [name] is a 34 y.o. [phone] at [redacted]w[redacted]d here for SROM and s/p precipitous uncomplicated NSVD.  #NSVD: uncomplicated delivery, see delivery note for details. Bilateral periuretheral lacerations, R side repaired in usual fashion with 4-0 Vircyl. Routine PP care.  #FWB: Apgars 8, 9 #ID: GBS Positive -  PCN started but only received a few minutes of first dose #MOF: Breast #MOC: Undecided but leaning towards LARC, outpatient IUD vs Nexplanon prior to discharge #COVID: rapid negative #Circ:  Yes, will discuss cost on PP (Medicaid) #Asthma: cont Symbicort (w in house subsitutation) and albuterol  Lurline Del, DO  09/04/2019, 9:54 AM   OB FELLOW ATTESTATION  I have seen and examined this patient and edited the above documentation in the resident's note to reflect any changes or updates.  Augustin Coupe, MD/[facility] OB Fellow  09/04/2019, 12:29 PM

## 2019-09-04 NOTE — Progress Notes (Signed)
Patient transported via EMS to [facility]. Armandina Stammer RN

## 2019-09-04 NOTE — Discharge Summary (Signed)
Postpartum Discharge Summary     Patient Name: Cynthia Wong DOB: [birth year] MRN: [id number]  Date of admission: 09/04/2019 Delivering Provider: Clarnce Flock   Date of discharge: 09/06/2019  Admitting diagnosis: PROM (premature rupture of membranes) [O42.90] Intrauterine pregnancy: [redacted]w[redacted]d    Secondary diagnosis:  Active Problems:   Supervision of high risk pregnancy, antepartum   History of stillbirth in currently pregnant patient, unspecified trimester   Alpha thalassemia silent carrier   Dyspnea   GBS (group B Streptococcus carrier), +RV culture, currently pregnant   PROM (premature rupture of membranes)   Asthma  Additional problems: None     Discharge diagnosis: Term Pregnancy Delivered                                                                                                Post partum procedures:None  Augmentation: None  Complications: None  Hospital course:  Onset of Labor With Vaginal Delivery     34y.o. yo [id number] 35w1das admitted in Active Labor on 09/04/2019. Patient had an uncomplicated labor course as follows: [facility] and found to have SROM and early labor, sent to L&D and shortly after arrival was fully dilated and had uncomplicated NSVD.  Membrane Rupture Time/Date: 9:35 AM ,09/04/2019   Intrapartum Procedures: Episiotomy: None [1]                                         Lacerations:  Periurethral [8]  Patient had a delivery of a Viable infant. 09/04/2019  Information for the patient's newborn:  FrQuinne, Pires0[[id number]]Delivery Method: Vaginal, Spontaneous(Filed from Delivery Summary)     Pateint had an uncomplicated postpartum course.  She is ambulating, tolerating a regular diet, passing flatus, and urinating well. Patient is discharged home in stable condition on 09/06/19.  Delivery time: 11:38 AM    Magnesium Sulfate received: No BMZ received: No Rhophylac:N/A MMR:N/A Transfusion:No  Physical exam  Vitals:    09/05/19 0300 09/05/19 1522 09/05/19 2137 09/06/19 0519  BP: 117/85 106/77 104/74 (!) 125/93  Pulse: 82 91 92   Resp: '18 18 16 18  '$ Temp: 98 F (36.7 C) 98 F (36.7 C) 98.8 F (37.1 C) 98.1 F (36.7 C)  TempSrc: Oral Oral Oral Oral  SpO2: 99%  99% 98%  Height:       General: alert, cooperative and no distress Lochia: appropriate Uterine Fundus: firm Incision: N/A DVT Evaluation: No evidence of DVT seen on physical exam. Labs: Lab Results  Component Value Date   WBC 10.5 09/05/2019   HGB 11.3 (L) 09/05/2019   HCT 36.2 09/05/2019   MCV 86.2 09/05/2019   PLT 183 09/05/2019   CMP Latest Ref Rng & Units 02/01/2013  Glucose 70 - 99 mg/dL 79  BUN 6 - 23 mg/dL 8  Creatinine 0.50 - 1.10 mg/dL 0.80  Sodium 135 - 145 mEq/L 140  Potassium 3.5 - 5.1 mEq/L 3.8  Chloride 96 - 112 mEq/L 106  Edinburgh Score: Edinburgh Postnatal Depression Scale Screening Tool 09/04/2019  I have been able to laugh and see the funny side of things. 0  I have looked forward with enjoyment to things. 0  I have blamed myself unnecessarily when things went wrong. 0  I have been anxious or worried for no good reason. 0  I have felt scared or panicky for no good reason. 0  Things have been getting on top of me. 0  I have been so unhappy that I have had difficulty sleeping. 1  I have felt sad or miserable. 1  I have been so unhappy that I have been crying. 1  The thought of harming myself has occurred to me. 0  Edinburgh Postnatal Depression Scale Total 3    Discharge instruction: per After Visit Summary and "Baby and Me Booklet".  After visit meds:  Allergies as of 09/06/2019   No Known Allergies     Medication List    TAKE these medications   acetaminophen 325 MG tablet Commonly known as: Tylenol Take 2 tablets (650 mg total) by mouth every 6 (six) hours as needed (for pain scale < 4).   albuterol 108 (90 Base) MCG/ACT inhaler Commonly known as: VENTOLIN HFA Inhale into the lungs.   AMBULATORY  NON FORMULARY MEDICATION 1 Device by Other route once a week. Blood pressure cuff/Medium  Monitored regularly at home ICD 10: Z34.90 LROB   budesonide-formoterol 80-4.5 MCG/ACT inhaler Commonly known as: Symbicort Inhale 2 puffs into the lungs 2 (two) times daily.   ibuprofen 600 MG tablet Commonly known as: ADVIL Take 1 tablet (600 mg total) by mouth every 6 (six) hours.   polyethylene glycol 17 g packet Commonly known as: MiraLax Take 17 g by mouth daily.   prenatal multivitamin Tabs tablet Take 1 tablet by mouth at bedtime.   senna-docusate 8.6-50 MG tablet Commonly known as: Senokot-S Take 2 tablets by mouth daily. Start taking on: September 07, 2019       Diet: routine diet  Activity: Advance as tolerated. Pelvic rest for 6 weeks.   Outpatient follow up:6 weeks Follow up Appt: Future Appointments  Date Time Provider [place]  10/04/2019  1:30 PM Truett Mainland, DO [facility] None   Follow up Visit:   Please schedule this patient for Postpartum visit in: 6 weeks with the following provider: Any provider In-Person For C/S patients schedule nurse incision check in weeks 2 weeks: no High risk pregnancy complicated by: hx of IUFD Delivery mode:  SVD Anticipated Birth Control:  other/unsure PP Procedures needed: none  Schedule Integrated BH visit: no   Newborn Data: Live born female  Birth Weight:  2926g APGAR: 52, 9  Newborn Delivery   Birth date/time: 09/04/2019 11:38:00 Delivery type: Vaginal, Spontaneous      Baby Feeding: Breast Disposition:home with mother   09/06/2019 Chauncey Mann, MD

## 2019-09-04 NOTE — Progress Notes (Signed)
Patient complaining of a lot of contractions/ pressure since late last night. Armandina Stammer RN

## 2019-09-04 NOTE — Lactation Note (Signed)
This note was copied from a baby's chart. Lactation Consultation Note  Patient Name: Cynthia Wong TFTDD'U Date: 09/04/2019 Reason for consult: Initial assessment;Early term 37-38.6wks;Primapara;1st time breastfeeding  P2 mother whose infant is now 44 hours old.  This is an ETI at 37+1 weeks.  Mother breast fed her first child (now 35 years old) for only a few weeks.  She had PPD and was not able to continue breastfeeding.  Baby was swaddled and in the bassinet when I arrived.  Taught mother hand expression and she was able to express approximately 1 ml of colostrum easily.  Container provided and milk storage times reviewed.  Finger feeding demonstrated.  Discussed the ETI and expectations for breast feeding.  Encouraged mother to feed 8-12 times/24 hours or sooner if baby shows feeding cues.  Suggested she awaken him every three hours if he does not self awaken.  Spent a lot of time educating on breast feeding basics including STS, tummy size, feeding cues, hand expression, latching and milk storage.  During our discussion baby began to awaken.  Offered to assist with latching and mother agreeable.  RN and pediatrician entered the room as I was getting ready to assist with latching.  Allowed them time for their assessments and finger fed the EBM that mother had obtained.  Assisted baby to latch in the football hold on the right breast without difficulty.  Mother's breasts are soft and non tender and nipples are large and everted.  Educated mother on the importance of obtaining a wide mouth to be able to latch effectively.  Demonstrated breast compressions and baby fed for 10 minutes before becoming too sleepy to continue.  Placed him STS on mother's chest where he fell asleep.  Offered the opportunity to begin pumping with the DEBP if desired.  Mother will let her RN know if she would like to initiate this later today.  Manual pump with instructions for use given and I suggested mother continue  practicing hand expression and using her manual pump to provide extra colostrum for supplementation.  Mother verbalized understanding.  Mom made aware of O/P services, breastfeeding support groups, community resources, and our phone # for post-discharge questions.   Mother does not have a DEBP for home use.  She is not currently a [facility] participant, however, plans to apply.  I suggested she call today to initiate since she will be needing a DEBP at  discharge.  Mother will follow through with this.  Also, explained our [facility] loaner program and asked mother to let us know if she is interested in obtaining a loaner until she can get a WIC pump.  At the end of my visit father returned with food for mother.      Maternal Data Formula Feeding for Exclusion: No Has patient been taught Hand Expression?: Yes Does the patient have breastfeeding experience prior to this delivery?: No  Feeding Feeding Type: Breast Fed  LATCH Score Latch: Repeated attempts needed to sustain latch, nipple held in mouth throughout feeding, stimulation needed to elicit sucking reflex.  Audible Swallowing: None  Type of Nipple: Everted at rest and after stimulation  Comfort (Breast/Nipple): Soft / non-tender  Hold (Positioning): Assistance needed to correctly position infant at breast and maintain latch.  LATCH Score: 6  Interventions Interventions: Breast feeding basics reviewed;Assisted with latch;Skin to skin;Breast massage;Hand express;Breast compression;Adjust position;Hand pump;Position options;Expressed milk;Support pillows  Lactation Tools Discussed/Used WIC Program: No(Mother plans to contact [facility])   Consult Status Consult Status: Follow-up Date:  09/05/19 Follow-up type: In-patient    Dora Sims 09/04/2019, 3:44 PM

## 2019-09-05 LAB — CBC
HCT: 36.2 % (ref 36.0–46.0)
Hemoglobin: 11.3 g/dL — ABNORMAL LOW (ref 12.0–15.0)
MCH: 26.9 pg (ref 26.0–34.0)
MCHC: 31.2 g/dL (ref 30.0–36.0)
MCV: 86.2 fL (ref 80.0–100.0)
Platelets: 183 10*3/uL (ref 150–400)
RBC: 4.2 MIL/uL (ref 3.87–5.11)
RDW: 13.9 % (ref 11.5–15.5)
WBC: 10.5 10*3/uL (ref 4.0–10.5)
nRBC: 0 % (ref 0.0–0.2)

## 2019-09-05 LAB — RPR: RPR Ser Ql: NONREACTIVE

## 2019-09-05 NOTE — Progress Notes (Signed)
Post Partum Day 1 Subjective: no complaints, up ad lib, voiding and tolerating PO  Objective: Blood pressure 117/85, pulse 82, temperature 98 F (36.7 C), temperature source Oral, resp. rate 18, height 5\' 4"  (1.626 m), last menstrual period 12/18/2018, SpO2 99 %, unknown if currently breastfeeding.  Physical Exam:  General: alert, cooperative and no distress Lochia: appropriate Uterine Fundus: firm Incision: n/a DVT Evaluation: No evidence of DVT seen on physical exam.  Recent Labs    09/04/19 1058 09/05/19 0519  HGB 12.5 11.3*  HCT 39.4 36.2    Assessment/Plan: Plan for discharge tomorrow and Breastfeeding   LOS: 1 day   11/05/19 09/05/2019, 8:29 AM

## 2019-09-06 MED ORDER — ACETAMINOPHEN 325 MG PO TABS: 650.0000 mg | ORAL_TABLET | Freq: Four times a day (QID) | ORAL | 0 refills | Status: DC | PRN

## 2019-09-06 MED ORDER — SENNOSIDES-DOCUSATE SODIUM 8.6-50 MG PO TABS: 2.0000 | ORAL_TABLET | ORAL | 0 refills | Status: DC

## 2019-09-06 MED ORDER — IBUPROFEN 600 MG PO TABS: 600.0000 mg | ORAL_TABLET | Freq: Four times a day (QID) | ORAL | 0 refills | Status: DC

## 2019-09-06 MED ORDER — POLYETHYLENE GLYCOL 3350 17 G PO PACK: 17.0000 g | PACK | Freq: Every day | ORAL | 0 refills | Status: DC

## 2019-09-06 NOTE — Lactation Note (Signed)
This note was copied from a baby's chart. Lactation Consultation Note  Patient Name: Cynthia Wong XIPPN'D Date: 09/06/2019 Reason for consult: Follow-up assessment;Early term 37-38.6wks;Engorgement  [id number] F/U visit with P2 mom who delivered @ 37.1wks, baby is now 51 hrs old with 7% wt loss; anticipated discharge today.  0922 approached by MB RN questioning starting DEBP just prior to discharge and mom without a DEBP for home use. Encouraged to set up to provide immediate relief of mom's significant engorgement.  LC entered room to find mom using DEBP with approximately 25m from right breast and 5-168min left breast. Mom currently using 2787mlanges but appears snug however mom denies pain/discomfort with pumping. Mom states she can tell her breasts are softening since pumping. FOB at bedside holding baby. Baby still cuing for feeding although mom states he just fed x20 min prior to her pumping. Baby fussy and sucking on hands.  Encouraged mom to put baby back to breast since baby still cuing and mom reports softening of her breasts. Mom with perfect technique and denies pain with latch. Mom states she has been doing football hold on each breast with every feeding; encouraged to alternate positions if she experiences any soreness. Demonstrated chin tug to increase flange of bottom lip and also increase angle of the mouth. Infant swallows noted about every 2 sucks but infant displaying non-nutritive suckling. Explained to mom that baby is transferring milk at this time because she is so engorged but reviewed the differences in nutritive vs. Non-nutritive suckling. Observed feeding x10 min and infant still at breast at LC [place] [facility] [facility] plans to purchase a Medela pump later today. Encouraged mom to take her DEBP accessories with her and demonstrated how to cut tubing to fit her personal DEBP. Also demonstrated how to assemble DEBP kit to double manual pump if  necessary. Mom with single harmony pump at bedside with over one ounce in collection bottle.  Mom given 18m18manges to use with next pumping session. Reviewed milk storage guidelines and cleaning/disassembly of pump pieces after each use. Reviewed sanitizing one daily. Mom provided with bath basin, toothbrush, and dish soap. Encouraged mom to review her MB handbook for further details.  Reviewed expected wet diapers of baby in the early days, feeding 8-12 times in 24 hours and with feeding cues; feeding cues reviewed. Reviewed treatment of engorgement with ice instead of heat. Reviewed OP lactation services, brochure with phone number, and virtual support group information on [email or web address] Feeding Type: Breast Fed  LATCH Score Latch: Grasps breast easily, tongue down, lips flanged, rhythmical sucking.  Audible Swallowing: Spontaneous and intermittent  Type of Nipple: Everted at rest and after stimulation  Comfort (Breast/Nipple): Soft / non-tender  Hold (Positioning): No assistance needed to correctly position infant at breast.  LATCH Score: 10  Interventions Interventions: Pre-pump if needed;Support pillows;Position options;Expressed milk;DEBP  Lactation Tools Discussed/Used Tools: Pump;Flanges Flange Size: 30 Breast pump type: Double-Electric Breast Pump;Manual WIC Program: No Pump Review: Setup, frequency, and cleaning;Milk Storage Initiated by:: MBRN/ceanes Date initiated:: 09/06/19   Consult Status Consult Status: Complete    CarlCranston Neighbor/2021, 10:29 AM

## 2019-09-11 ENCOUNTER — Encounter (HOSPITAL_COMMUNITY): Payer: Self-pay

## 2019-09-11 ENCOUNTER — Ambulatory Visit (HOSPITAL_COMMUNITY): Payer: Medicaid Other

## 2019-10-04 ENCOUNTER — Other Ambulatory Visit: Payer: Self-pay

## 2019-10-04 ENCOUNTER — Encounter: Payer: Self-pay | Admitting: Family Medicine

## 2019-10-04 ENCOUNTER — Ambulatory Visit (INDEPENDENT_AMBULATORY_CARE_PROVIDER_SITE_OTHER): Payer: Medicaid Other | Admitting: Family Medicine

## 2019-10-04 DIAGNOSIS — Z1389 Encounter for screening for other disorder: Secondary | ICD-10-CM

## 2019-10-04 DIAGNOSIS — Z30430 Encounter for insertion of intrauterine contraceptive device: Secondary | ICD-10-CM | POA: Diagnosis not present

## 2019-10-04 MED ORDER — LEVONORGESTREL 19.5 MCG/DAY IU IUD
INTRAUTERINE_SYSTEM | Freq: Once | INTRAUTERINE | Status: AC
  Administered 2019-10-04: 1 via INTRAUTERINE

## 2019-10-04 NOTE — Progress Notes (Signed)
    Post Partum Visit Note  Cynthia Wong is a 34 y.o. G4W1027 female who presents for a postpartum visit. She is 4 weeks postpartum following a normal spontaneous vaginal delivery.  I have fully reviewed the prenatal and intrapartum course. The delivery was at 37.1 gestational weeks.  Anesthesia: none. Postpartum course has been uneventful. Baby is doing well uneventful . Baby is feeding by bottle. Bleeding no bleeding. Bowel function is normal. Bladder function is normal. Patient is not sexually active. Contraception method is none. Postpartum depression screening: negative. score1  The following portions of the patient's history were reviewed and updated as appropriate: allergies, current medications, past family history, past medical history, past social history, past surgical history and problem list.  Review of Systems  Objective:  Blood pressure 109/66, pulse 82, weight 160 lb (72.6 kg), last menstrual period 12/18/2018, unknown if currently breastfeeding.  General:  alert, cooperative and no distress  Lungs: clear to auscultation bilaterally  Heart:  regular rate and rhythm, S1, S2 normal, no murmur, click, rub or gallop  Abdomen: soft, non-tender; bowel sounds normal; no masses,  no organomegaly   Vulva:  normal  Vagina: normal vagina, no discharge, exudate, lesion, or erythema  Cervix:  multiparous appearance        IUD Procedure Note Patient identified, informed consent performed, signed copy in chart, time out was performed.  Urine pregnancy test negative.  Speculum placed in the vagina.  Cervix visualized.  Cleaned with Betadine x 2.  Grasped anteriorly with a single tooth tenaculum.  Uterus sounded to 9 cm.  Liletta  IUD placed per manufacturer's recommendations.  Strings trimmed to 3 cm. Tenaculum was removed, good hemostasis noted.  Patient tolerated procedure well.   Patient given post procedure instructions and Liletta care card with expiration date.  Patient is asked  to check IUD strings periodically and follow up in 4-6 weeks for IUD check.   Assessment:    Normal postpartum exam. Pap smear not done at today's visit. Last pap Sept 2020.  Plan:   Essential components of care per ACOG recommendations:  1.  Mood and well being: Patient with negative depression screening today. Reviewed local resources for support.  - Patient does not use tobacco.  - hx of drug use? No    2. Infant care and feeding:  -Patient currently breastmilk feeding? No  -Social determinants of health (SDOH) reviewed in EPIC. No concerns  3. Sexuality, contraception and birth spacing - Patient does not want a pregnancy in the next year.   - Reviewed forms of contraception in tiered fashion. Patient desired IUD today.   - Discussed birth spacing of 18 months  4. Sleep and fatigue -Encouraged family/partner/community support of 4 hrs of uninterrupted sleep to help with mood and fatigue  5. Physical Recovery  - Discussed patients delivery and complications - Patient had a labial laceration, perineal healing reviewed. Patient expressed understanding - Patient has urinary incontinence? No - Patient is safe to resume physical and sexual activity  6.  Health Maintenance - Last pap smear done 03/2019 and was normal with negative HPV.  Levie Heritage, DO [facility] [employer], [facility]

## 2019-10-04 NOTE — Addendum Note (Signed)
Addended by: Anell Barr on: 10/04/2019 03:09 PM   Modules accepted: Orders

## 2019-10-15 DIAGNOSIS — Z1152 Encounter for screening for COVID-19: Secondary | ICD-10-CM | POA: Diagnosis not present

## 2019-10-15 DIAGNOSIS — B349 Viral infection, unspecified: Secondary | ICD-10-CM | POA: Diagnosis not present

## 2019-11-15 ENCOUNTER — Ambulatory Visit (INDEPENDENT_AMBULATORY_CARE_PROVIDER_SITE_OTHER): Payer: Medicaid Other | Admitting: Family Medicine

## 2019-11-15 ENCOUNTER — Other Ambulatory Visit: Payer: Self-pay

## 2019-11-15 ENCOUNTER — Encounter: Payer: Self-pay | Admitting: Family Medicine

## 2019-11-15 VITALS — BP 116/77 | HR 80 | Wt 160.0 lb

## 2019-11-15 DIAGNOSIS — Z30431 Encounter for routine checking of intrauterine contraceptive device: Secondary | ICD-10-CM

## 2019-11-15 MED ORDER — NORETHIN ACE-ETH ESTRAD-FE 1.5-30 MG-MCG PO TABS: 1.0000 | ORAL_TABLET | Freq: Every day | ORAL | 0 refills | Status: DC

## 2019-11-15 NOTE — Progress Notes (Signed)
Patient is still having bleeding daily. Armandina Stammer RN

## 2019-11-15 NOTE — Progress Notes (Signed)
   Subjective:   Patient Name: Cynthia Wong, female   DOB: 1986-02-09, 34 y.o.  MRN: [id number]  HPI Patient here for an IUD check.  She had the Liletta IUD placed 1 month ago.  She reports continued spotting/bleeding.   Review of Systems  Constitutional: Negative for fever and chills.  Gastrointestinal: Negative for abdominal pain.  Genitourinary: Negative for vaginal discharge, vaginal pain, pelvic pain and dyspareunia.        Objective:   Physical Exam  Constitutional: She appears well-developed and well-nourished.  HENT:  Head: Normocephalic and atraumatic.  Abdominal: Soft. There is no tenderness. There is no guarding.  Genitourinary: There is no rash, tenderness or lesion on the right labia. There is no rash, tenderness or lesion on the left labia. No erythema or tenderness in the vagina. No foreign body around the vagina. No signs of injury around the vagina. No vaginal discharge found.    Skin: Skin is warm and dry.  Psychiatric: She has a normal mood and affect. Her behavior is normal. Judgment and thought content normal.       Assessment & Plan:  1. IUD check up IUD in place. Will do a month of loestrin to control the bleeding. Pt to call with any other problems.  Recheck in 1 year.

## 2020-02-16 DIAGNOSIS — R519 Headache, unspecified: Secondary | ICD-10-CM | POA: Diagnosis not present

## 2020-02-16 DIAGNOSIS — Z20822 Contact with and (suspected) exposure to covid-19: Secondary | ICD-10-CM | POA: Diagnosis not present

## 2020-03-21 DIAGNOSIS — Z20822 Contact with and (suspected) exposure to covid-19: Secondary | ICD-10-CM | POA: Diagnosis not present

## 2020-12-07 IMAGING — US US MFM OB FOLLOW-UP
1 series · 13 of 28 positions shown · non-contrast
Comparison: none

[Series 1: us mfm ob follow-up · 49 acquisitions, 13 frames shown]
[im 2/49]
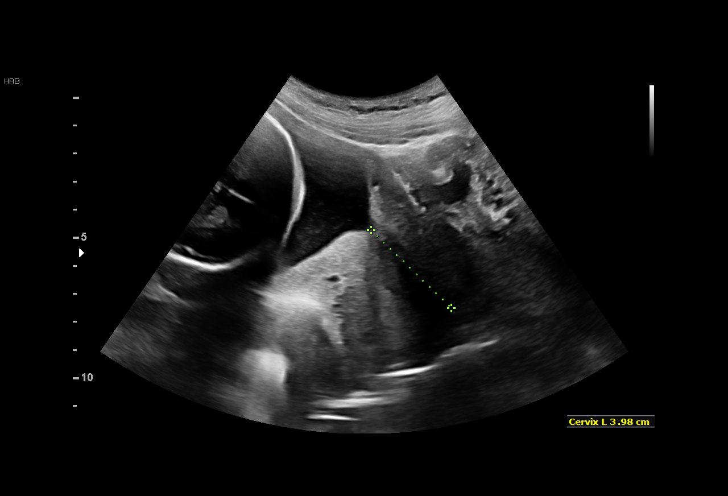
[im 6/49]
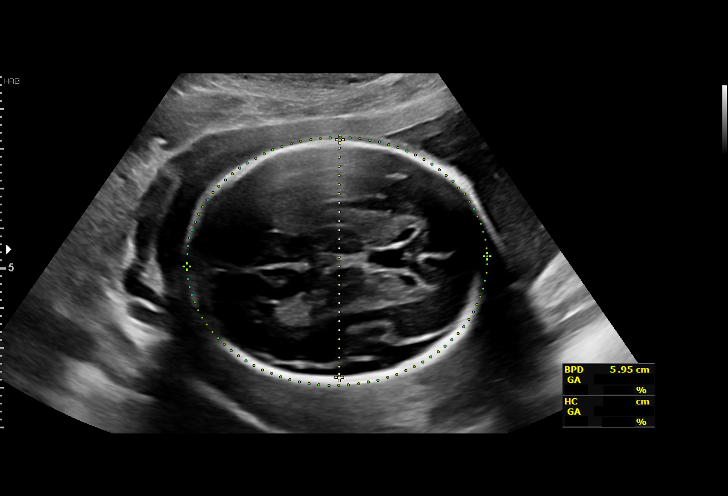
[im 9/49]
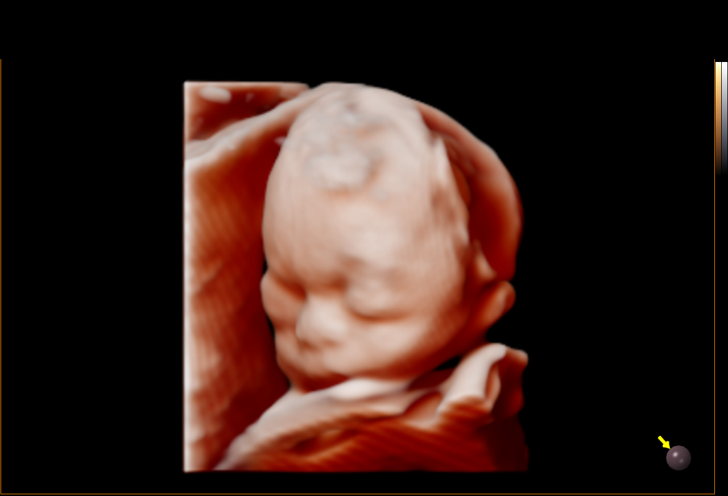
[im 13/49]
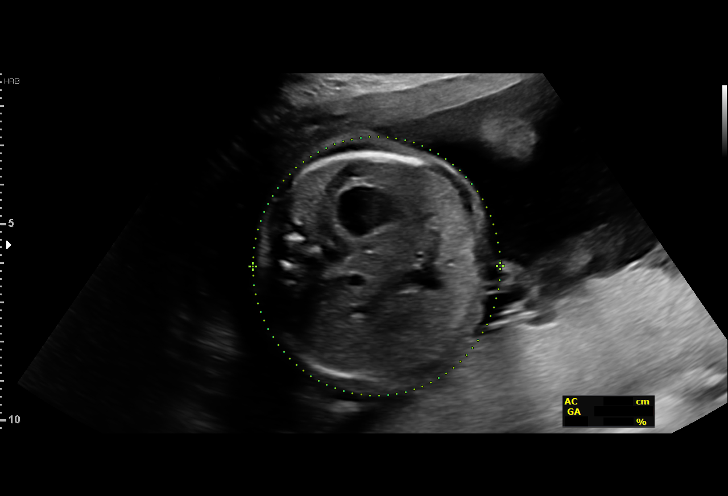
[im 17/49]
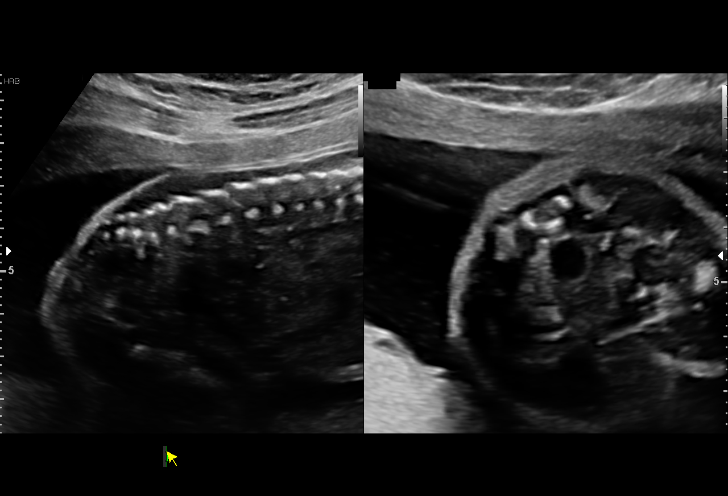
[im 20/49]
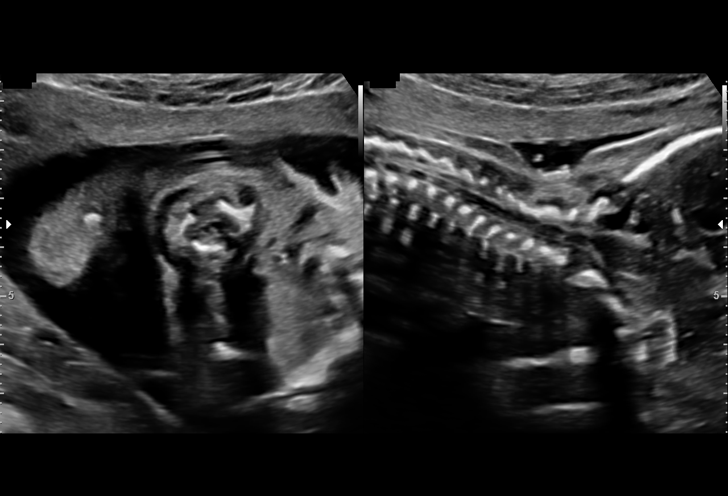
[im 25/49]
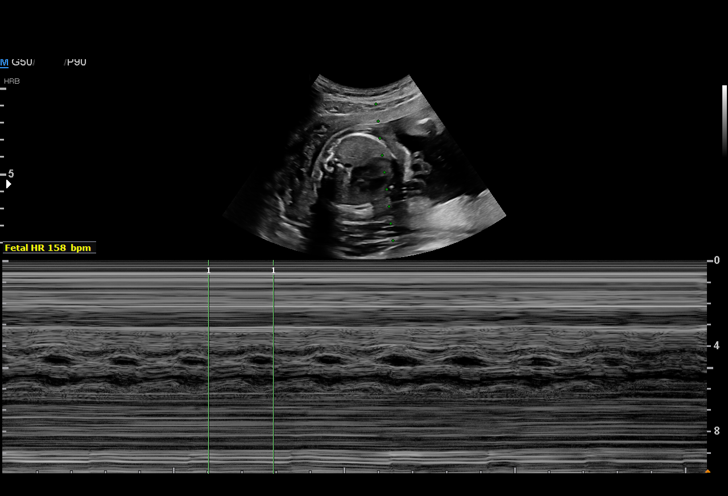
[im 29/49]
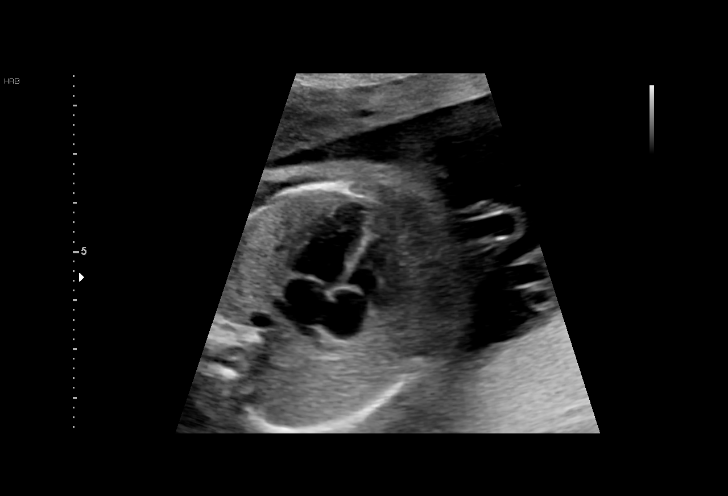
[im 33/49]
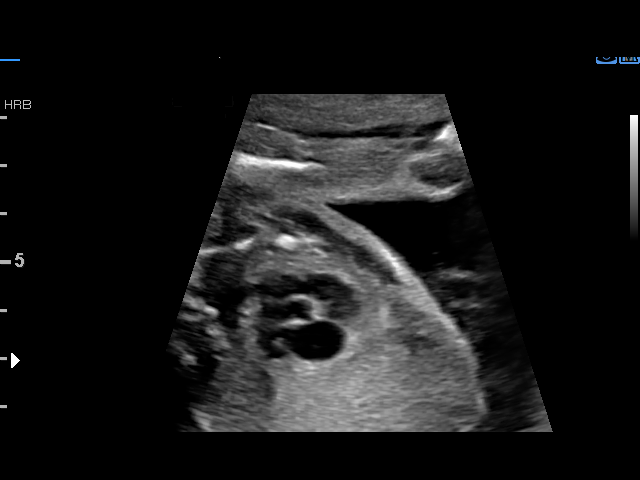
[im 36/49]
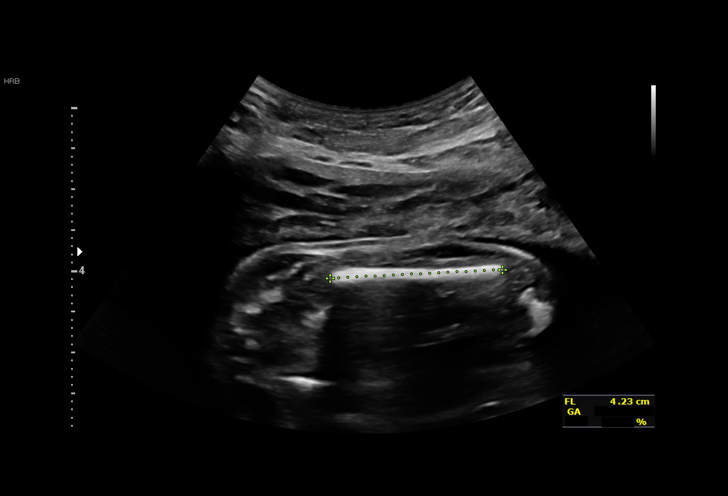
[im 40/49]
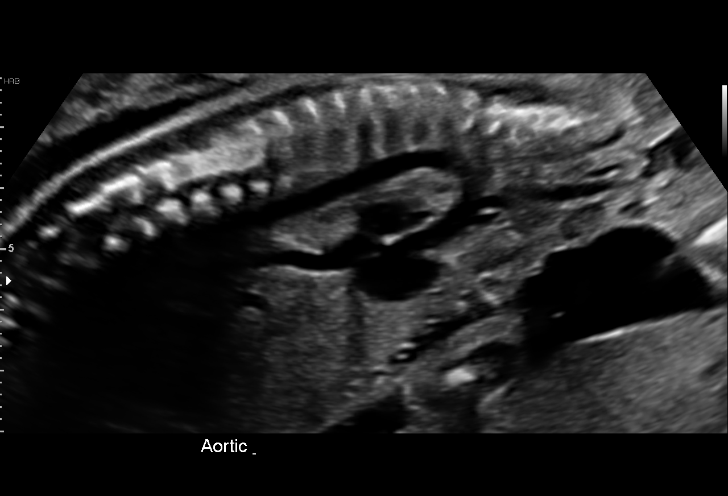
[im 43/49]
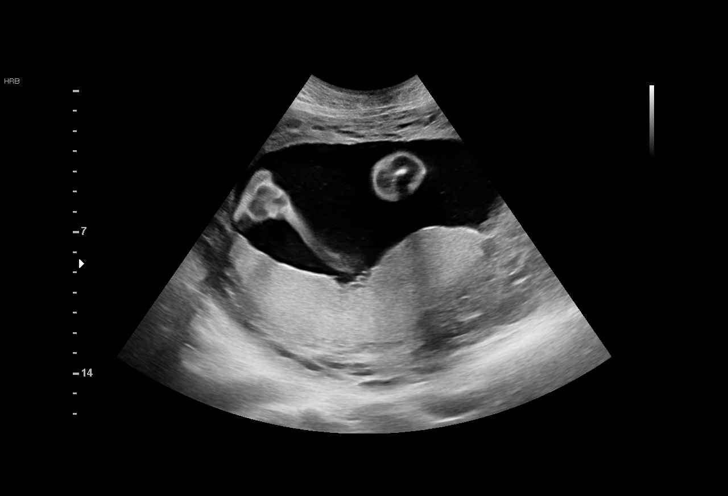
[im 47/49]
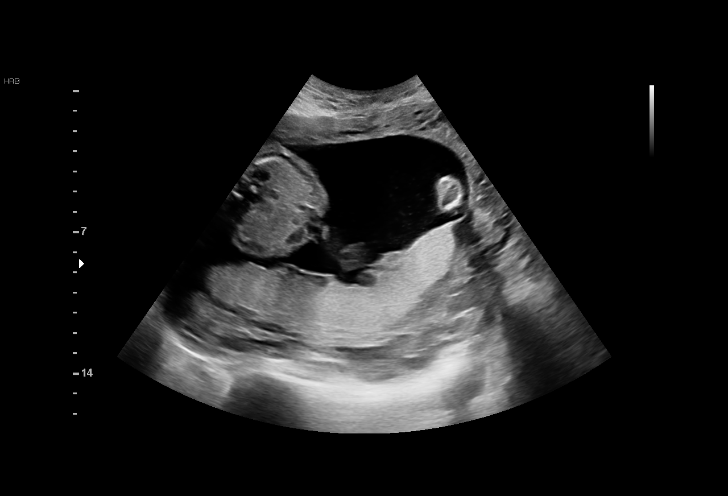

[13 of 28 positions shown; findings below may reference images not displayed]

----------------------------------------------------------------------

 ----------------------------------------------------------------------
Indications

  Poor obstetric history: Previous IUFD
  (stillbirth)
  Encounter for other antenatal screening
  follow-up
  23 weeks gestation of pregnancy
  Genetic carrier ([place] Harpa Ronlor)
 ----------------------------------------------------------------------
Vital Signs

 BMI:
Fetal Evaluation

 Num Of Fetuses:         1
 Fetal Heart Rate(bpm):  158
 Cardiac Activity:       Observed
 Presentation:           Cephalic
 Placenta:               Posterior
 P. Cord Insertion:      Visualized, central

 Amniotic Fluid
 AFI FV:      Within normal limits

                             Largest Pocket(cm)

Biometry

 BPD:      60.2  mm     G. Age:  24w 4d         74  %    CI:        76.11   %    70 - 86
                                                         FL/HC:      19.0   %    18.7 -
 HC:      218.7  mm     G. Age:  23w 6d         40  %    HC/AC:      1.08        1.05 -
 AC:      202.6  mm     G. Age:  24w 6d         77  %    FL/BPD:     68.9   %    71 - 87
 FL:       41.5  mm     G. Age:  23w 3d         30  %    FL/AC:      20.5   %    20 - 24

 Est. FW:     677  gm      1 lb 8 oz     68  %
OB History

 Gravidity:    6         Term:   1        Prem:   1        SAB:   1
 TOP:          2        Living:  1
Gestational Age

 U/S Today:     24w 1d                                        EDD:   09/18/19
 Best:          23w 5d     Det. By:  Previous Ultrasound      EDD:   09/21/19
                                     (02/06/19)
Anatomy

 Cranium:               Appears normal         Aortic Arch:            Previously seen
 Cavum:                 Appears normal         Ductal Arch:            Previously seen
 Ventricles:            Appears normal         Diaphragm:              Appears normal
 Choroid Plexus:        Previously seen        Stomach:                Appears normal, left
                                                                       sided
 Cerebellum:            Previously seen        Abdomen:                Appears normal
 Posterior Fossa:       Previously seen        Abdominal Wall:         Previously seen
 Nuchal Fold:           Previously seen        Cord Vessels:           Previously seen
 Face:                  Profile nl; orbits     Kidneys:                Appear normal
                        previously seen
 Lips:                  Appears normal         Bladder:                Appears normal
 Thoracic:              Appears normal         Spine:                  Appears normal
 Heart:                 Appears normal         Upper Extremities:      Previously seen
                        (4CH, axis, and
                        situs)
 RVOT:                  Appears normal         Lower Extremities:      Appears normal
 LVOT:                  Previously seen

 Other:  Male gender.
Cervix Uterus Adnexa

 Cervix
 Length:           3.98  cm.
 Normal appearance by transabdominal scan.
Comments

 This patient was seen for a follow up exam as the fetal
 cardiac views were unable to be fully visualized during her
 prior exam.  She is also being followed due to a history of a
 prior IUFD.  She denies any problems since her last exam.
 She was informed that the fetal growth and amniotic fluid
 level appears appropriate for her gestational age.
 The views of the fetal heart were visualized today.  There
 were no obvious anomalies suspected.  The limitations of
 ultrasound in the detection of all anomalies was discussed.
 Due to a significant family history of stillbirths, the patient had
 her blood drawn today to screen for the antiphospholipid
 antibody syndrome.
 We will continue to follow her with serial growth ultrasounds
 due to her history of a prior IUFD.  A follow-up exam was
 scheduled in 4 weeks.

## 2021-01-01 ENCOUNTER — Other Ambulatory Visit (HOSPITAL_COMMUNITY)
Admission: RE | Admit: 2021-01-01 | Discharge: 2021-01-01 | Disposition: A | Discharge status: Home / Self Care | Payer: Medicaid Other | Source: Ambulatory Visit | Attending: Family Medicine | Admitting: Family Medicine

## 2021-01-01 ENCOUNTER — Other Ambulatory Visit: Payer: Self-pay

## 2021-01-01 ENCOUNTER — Ambulatory Visit (INDEPENDENT_AMBULATORY_CARE_PROVIDER_SITE_OTHER): Payer: Medicaid Other

## 2021-01-01 VITALS — BP 124/67 | HR 88 | Ht 64.0 in | Wt 151.0 lb

## 2021-01-01 DIAGNOSIS — N898 Other specified noninflammatory disorders of vagina: Secondary | ICD-10-CM | POA: Insufficient documentation

## 2021-01-01 NOTE — Progress Notes (Signed)
SUBJECTIVE:  35 y.o. female complains of malodorous vaginal discharge for 3 day(s). Denies abnormal vaginal bleeding or significant pelvic pain or fever. No UTI symptoms. Denies history of known exposure to STD.  LMP: 12/17/20  OBJECTIVE:  She appears well, afebrile. Urine dipstick: not done.  ASSESSMENT:  Vaginal Discharge  Vaginal Odor   PLAN:  GC, chlamydia, trichomonas, BVAG, CVAG probe sent to lab. Treatment: To be determined once lab results are received ROV prn if symptoms persist or worsen.

## 2021-01-02 ENCOUNTER — Other Ambulatory Visit: Payer: Self-pay | Admitting: Family Medicine

## 2021-01-02 LAB — CERVICOVAGINAL ANCILLARY ONLY
Bacterial Vaginitis (gardnerella): POSITIVE — AB
Candida Glabrata: NEGATIVE
Candida Vaginitis: NEGATIVE
Chlamydia: NEGATIVE
Comment: NEGATIVE
Comment: NEGATIVE
Comment: NEGATIVE
Comment: NEGATIVE
Comment: NEGATIVE
Comment: NORMAL
Neisseria Gonorrhea: NEGATIVE
Trichomonas: NEGATIVE

## 2021-01-02 MED ORDER — METRONIDAZOLE 500 MG PO TABS: 500.0000 mg | ORAL_TABLET | Freq: Two times a day (BID) | ORAL | 0 refills | Status: DC

## 2021-02-01 IMAGING — US US MFM FETAL BPP W/O NON-STRESS
1 series · 10 of 10 positions shown · non-contrast
Comparison: none

[Series 1: us mfm fetal bpp w/o non-stress · 10 acquisitions, 10 frames shown]
[im 1/10]
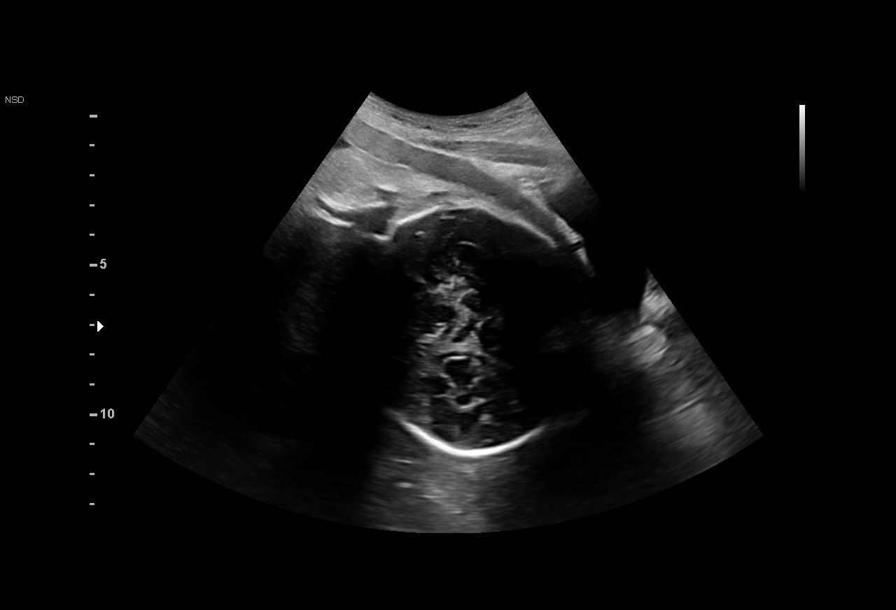
[im 2/10]
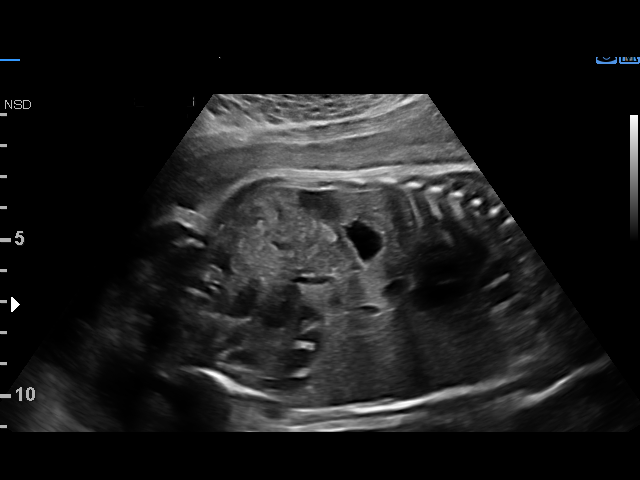
[im 3/10]
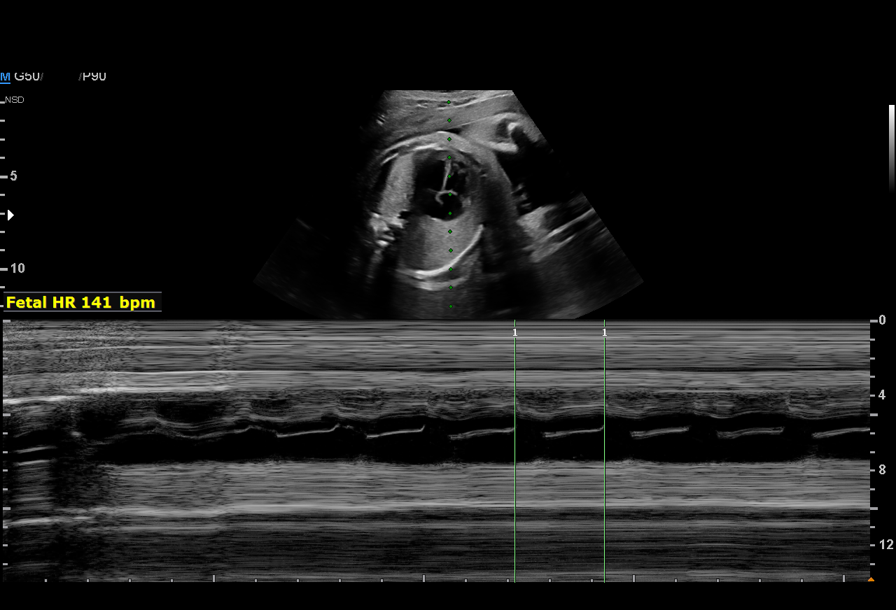
[im 4/10]
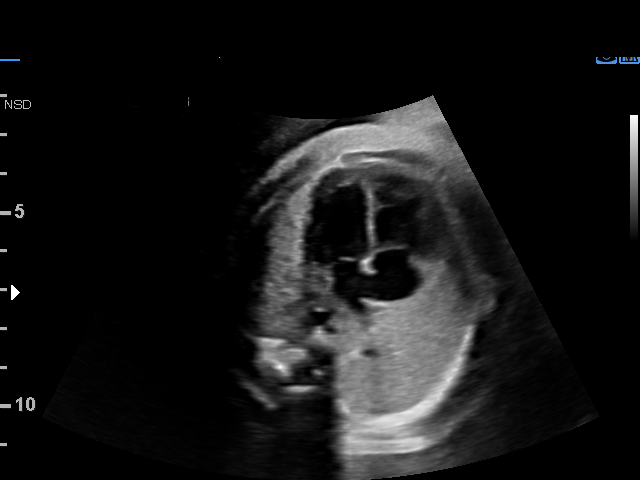
[im 5/10]
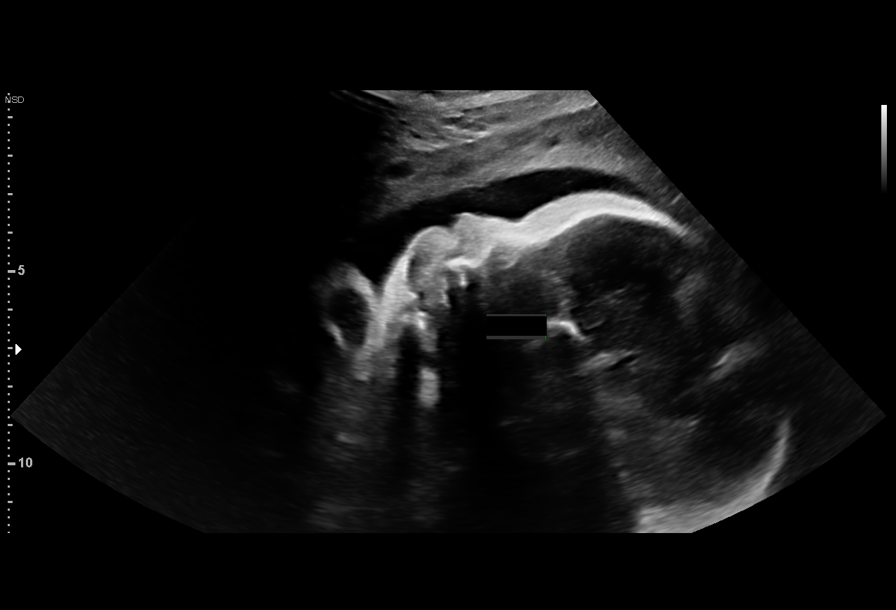
[im 6/10]
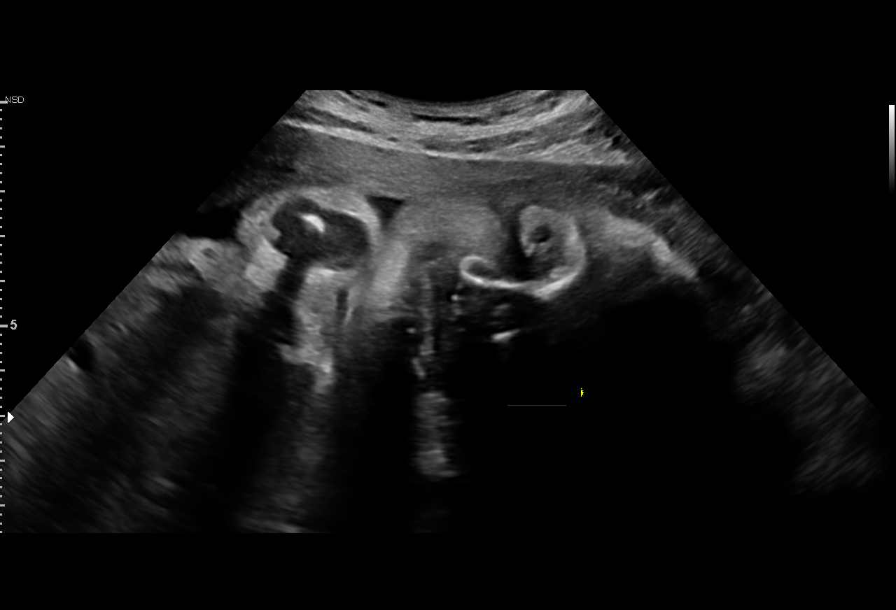
[im 7/10]
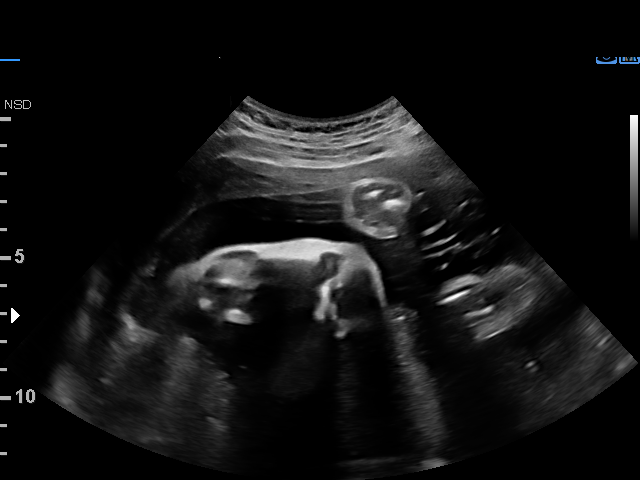
[im 8/10]
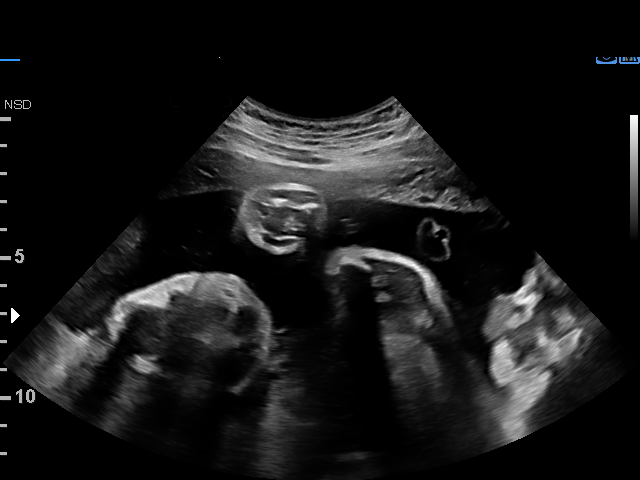
[im 9/10]
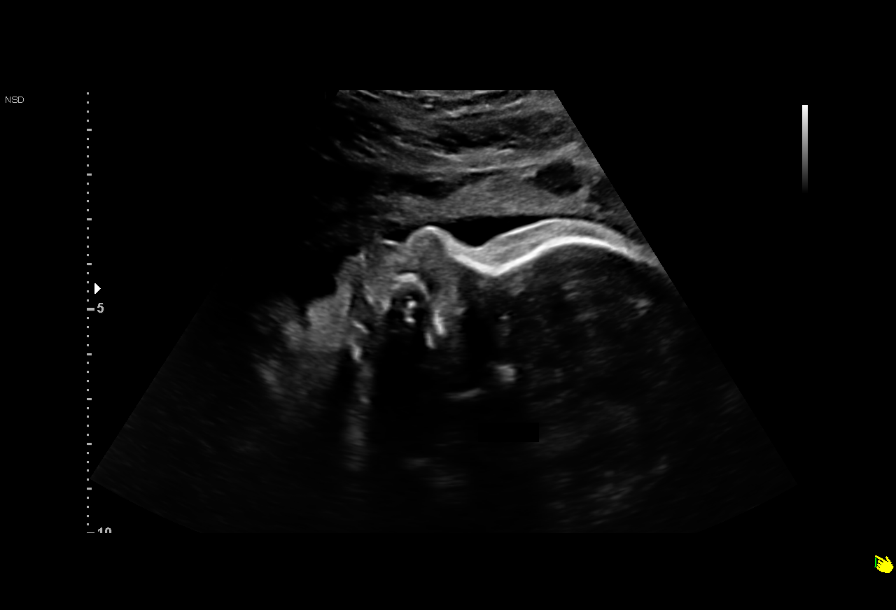
[im 10/10]
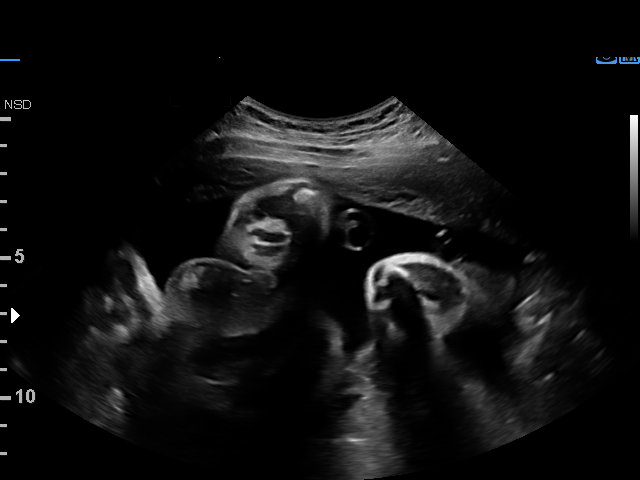

[10 of 10 positions shown; findings below may reference images not displayed]

----------------------------------------------------------------------

 ----------------------------------------------------------------------
Indications

  Poor obstetric history: Previous IUFD
  (stillbirth)
  31 weeks gestation of pregnancy
  Encounter for other antenatal screening
  follow-up
  Genetic carrier (Negrita Ruddock)
 ----------------------------------------------------------------------
Vital Signs

                                                Height:        5'4"
Fetal Evaluation

 Num Of Fetuses:          1
 Fetal Heart Rate(bpm):   141
 Cardiac Activity:        Observed
 Presentation:            Cephalic

 Amniotic Fluid
 AFI FV:      Within normal limits

 AFI Sum(cm)     %Tile       Largest Pocket(cm)
 12.95           39

 RUQ(cm)       RLQ(cm)       LUQ(cm)        LLQ(cm)
 3.51          2
Biophysical Evaluation

 Amniotic F.V:   Within normal limits       F. Tone:         Observed
 F. Movement:    Observed                   Score:           [DATE]
 F. Breathing:   Observed
OB History

 Gravidity:    6         Term:   1        Prem:   1        SAB:   1
 TOP:          2        Living:  1
Gestational Age

 Best:          31w 5d     Det. By:  Previous Ultrasound      EDD:   09/21/19
                                     (02/06/19)
Impression

 Biophysical proflle [DATE]
 Follow testing performed given history of IUFD
 There is good fetal mvement and amount fluid
Recommendations

 Follow up growth and BPP scheduled for 08/01/19

## 2021-02-21 IMAGING — US US MFM FETAL BPP W/O NON-STRESS
1 series · 15 of 28 positions shown · non-contrast
Comparison: none

[Series 1: us mfm fetal bpp w/o non-stress · 52 acquisitions, 15 frames shown]
[im 1/52]
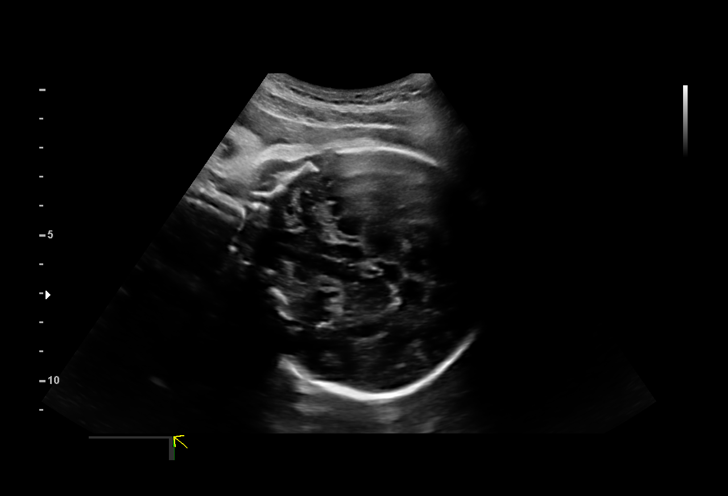
[im 4/52]
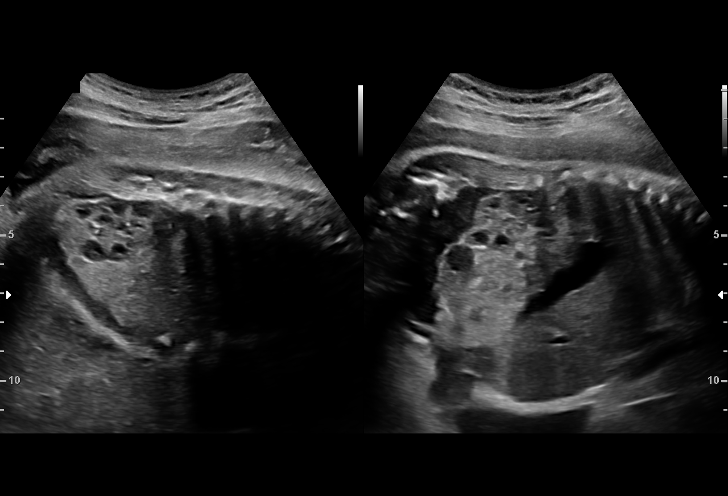
[im 8/52]
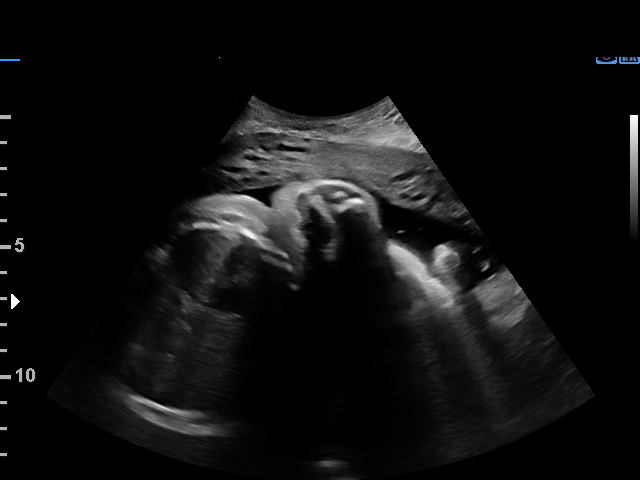
[im 12/52]
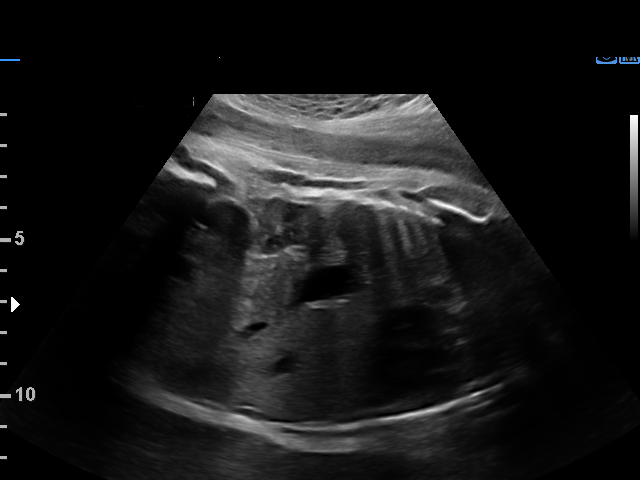
[im 16/52]
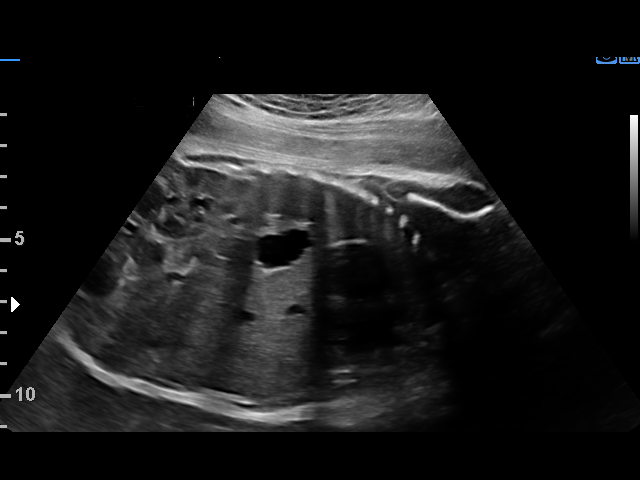
[im 19/52]
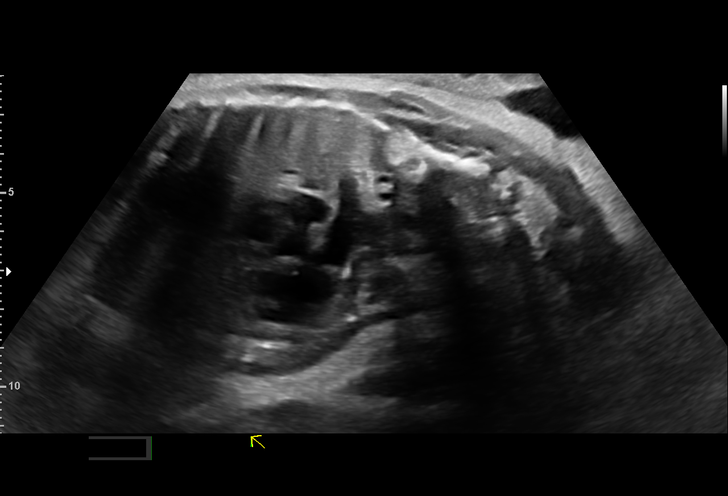
[im 23/52]
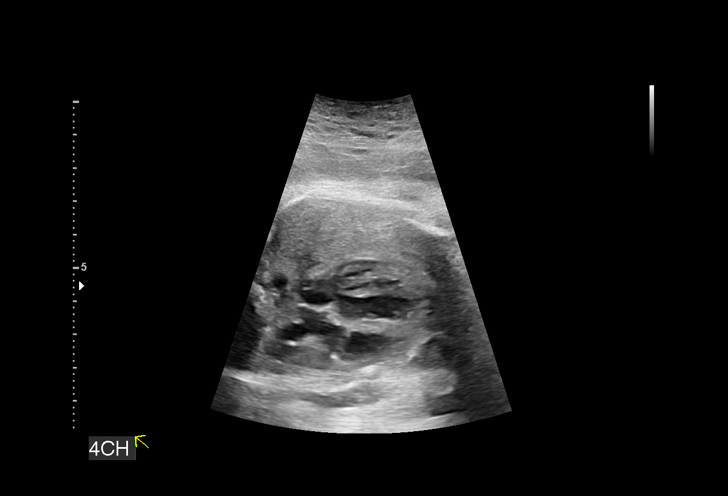
[im 27/52]
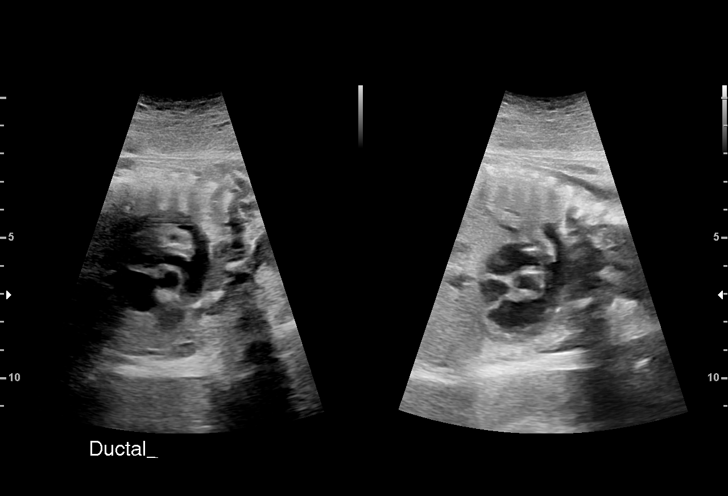
[im 29/52]
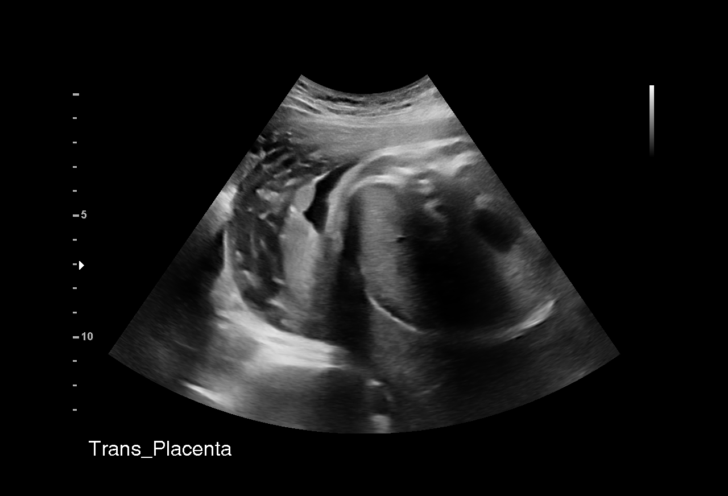
[im 33/52]
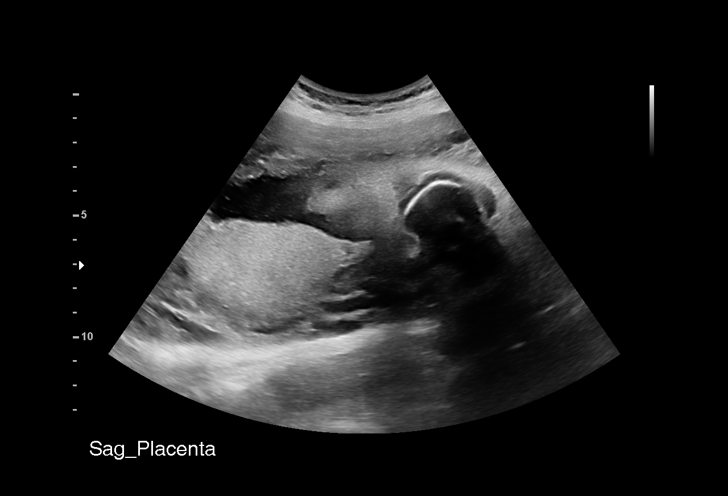
[im 36/52]
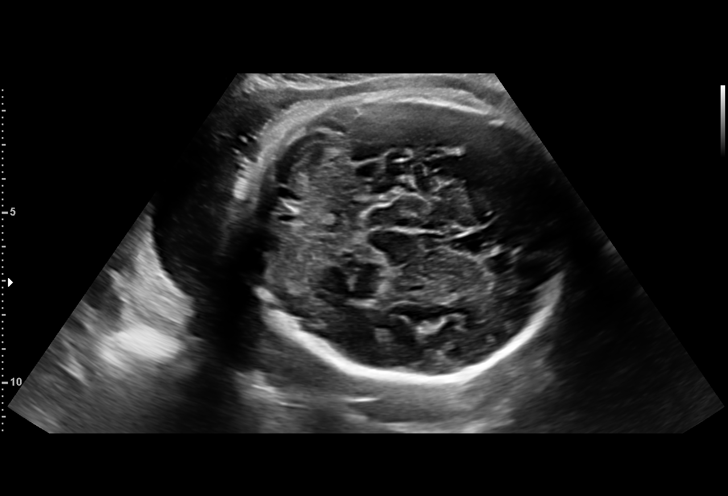
[im 40/52]
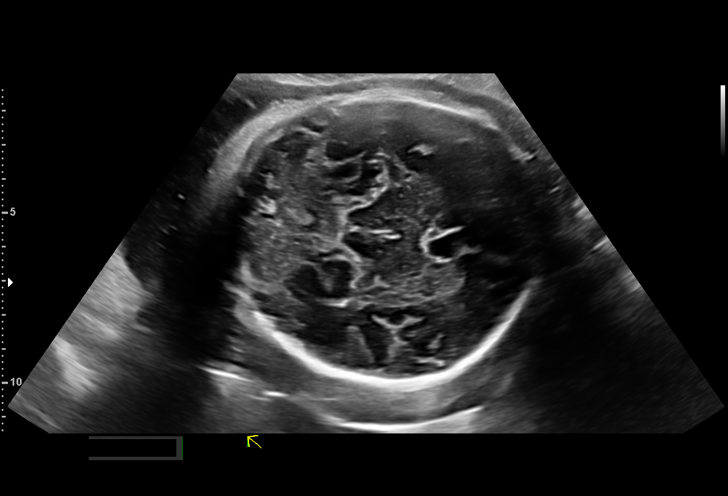
[im 44/52]
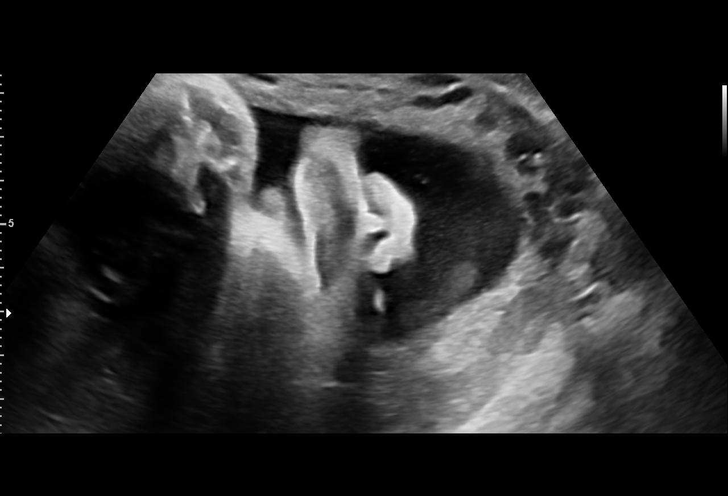
[im 48/52]
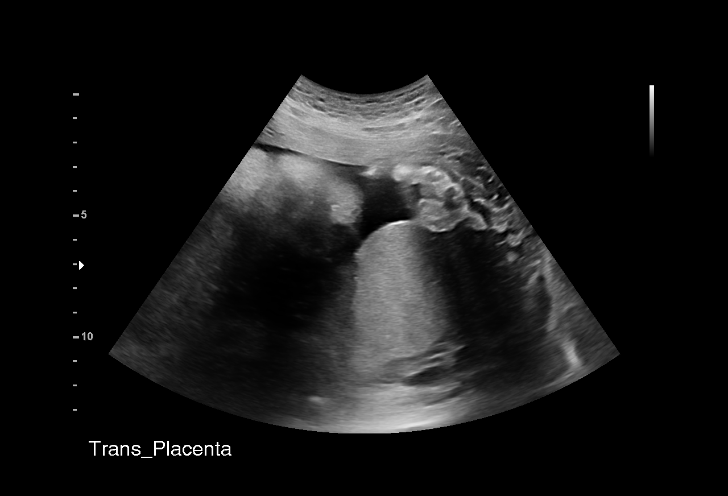
[im 52/52]
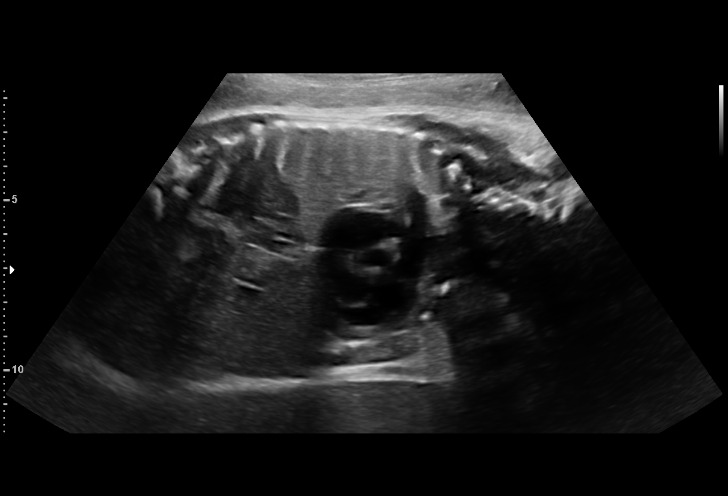

[15 of 28 positions shown; findings below may reference images not displayed]

----------------------------------------------------------------------

 ----------------------------------------------------------------------
Indications

  Poor obstetric history: Previous IUFD
  (stillbirth) (28 w)
  Encounter for other antenatal screening
  follow-up
  Genetic carrier (Shayla Jumper)
  34 weeks gestation of pregnancy
 ----------------------------------------------------------------------
Vital Signs

                                                Height:        5'4"
Fetal Evaluation

 Num Of Fetuses:          1
 Fetal Heart Rate(bpm):   146
 Cardiac Activity:        Observed
 Presentation:            Cephalic
 Placenta:                Posterior
 P. Cord Insertion:       Previously Visualized

 Amniotic Fluid
 AFI FV:      Within normal limits

 AFI Sum(cm)     %Tile       Largest Pocket(cm)
 17.14           63

 RUQ(cm)       RLQ(cm)       LUQ(cm)        LLQ(cm)

Biophysical Evaluation

 Amniotic F.V:   Within normal limits       F. Tone:         Observed
 F. Movement:    Observed                   Score:           [DATE]
 F. Breathing:   Observed
OB History

 Gravidity:    6         Term:   1        Prem:   1        SAB:   1
 TOP:          2        Living:  1
Gestational Age

 Best:          34w 4d     Det. By:  Previous Ultrasound      EDD:   09/21/19
                                     (02/06/19)
Anatomy

 Cranium:               Appears normal         Aortic Arch:            Appears normal
 Cavum:                 Appears normal         Ductal Arch:            Appears normal
 Ventricles:            Appears normal         Diaphragm:              Appears normal
 Cerebellum:            Appears normal         Stomach:                Appears normal, left
                                                                       sided
 Posterior Fossa:       Appears normal         Cord Vessels:           Appears normal (3
                                                                       vessel cord)
 Lips:                  Appears normal         Kidneys:                Appear normal
 Heart:                 Appears normal         Bladder:                Appears normal
                        (4CH, axis, and
                        situs)
 RVOT:                  Appears normal
Impression

 Biophysical profile [DATE]
 Prior IUFD at 28 weeks
 Good fetal movement and amniotic fluid.
Recommendations

 Continue weekly testing
 Repeat growth in 2 weeks was scheduled.

## 2021-03-07 IMAGING — US US MFM OB FOLLOW-UP
1 series · 13 of 28 positions shown · non-contrast
Comparison: none

[Series 1: us mfm ob follow-up · 55 acquisitions, 13 frames shown]
[im 3/55]
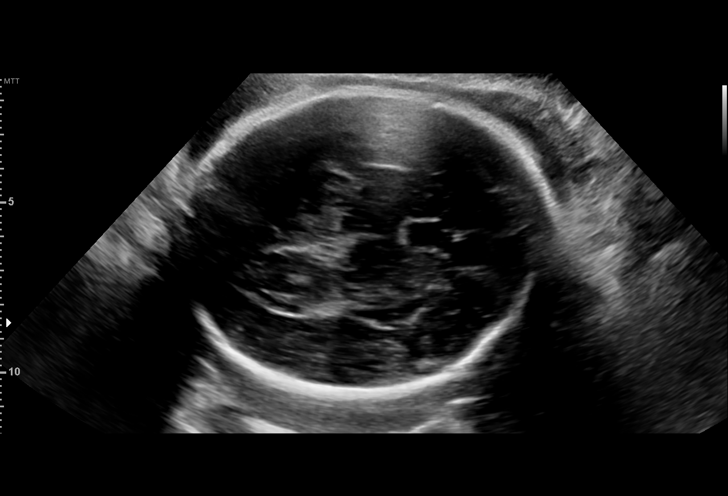
[im 7/55]
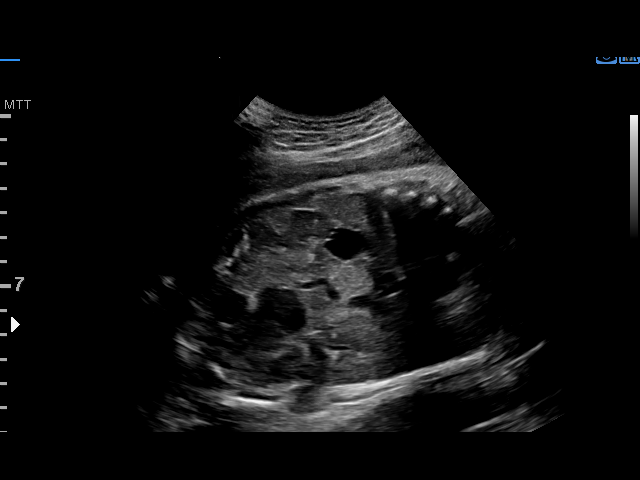
[im 11/55]
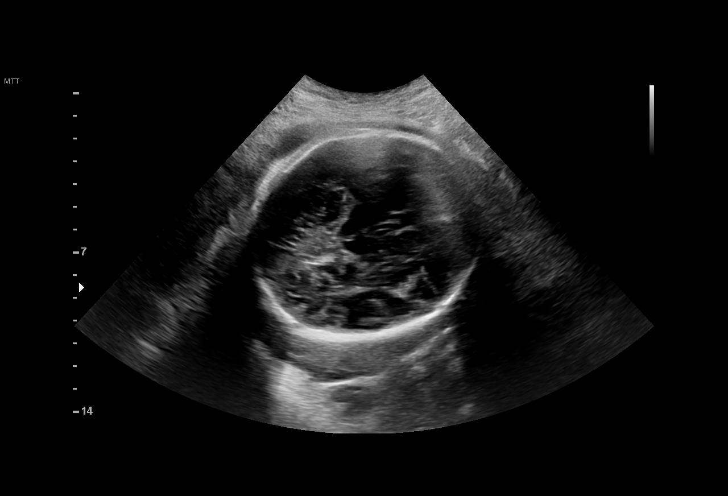
[im 15/55]
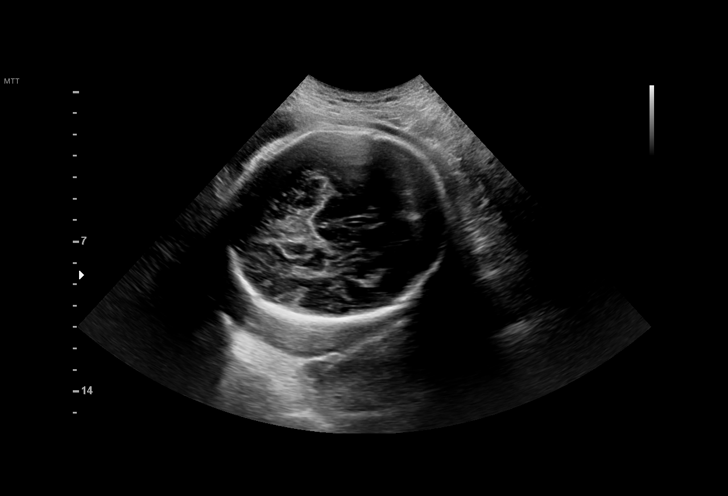
[im 19/55]
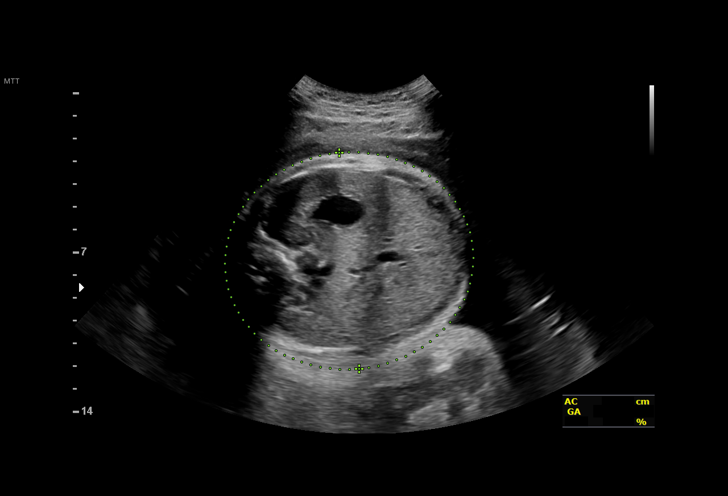
[im 23/55]
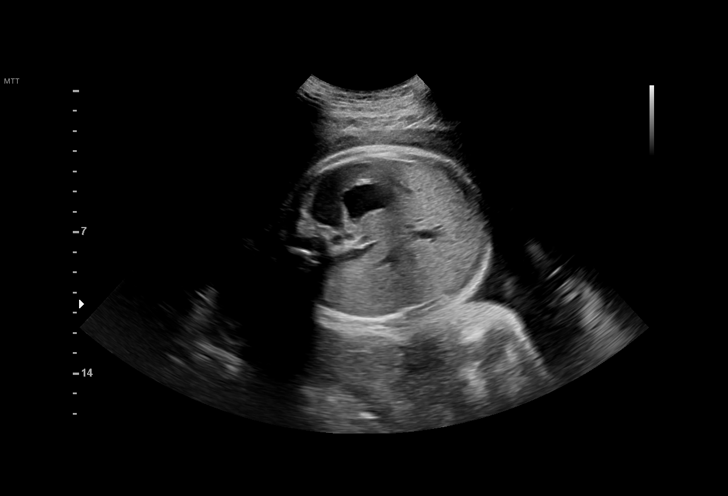
[im 29/55]
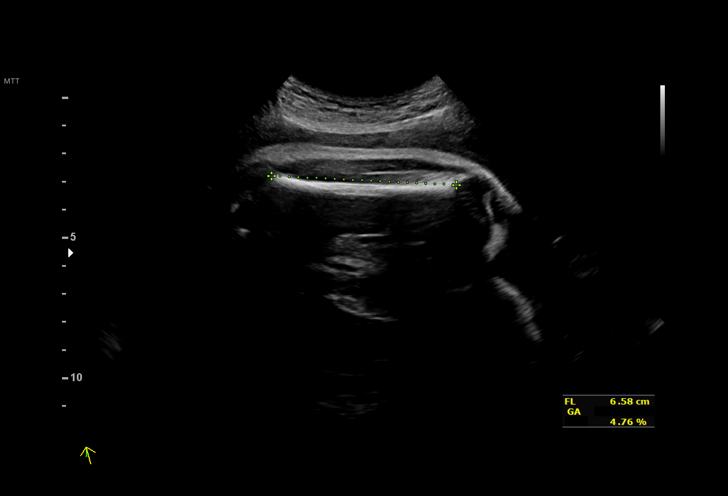
[im 33/55]
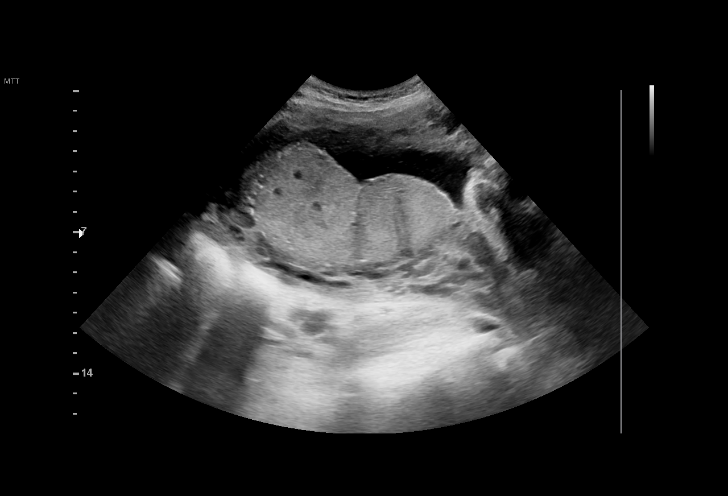
[im 37/55]
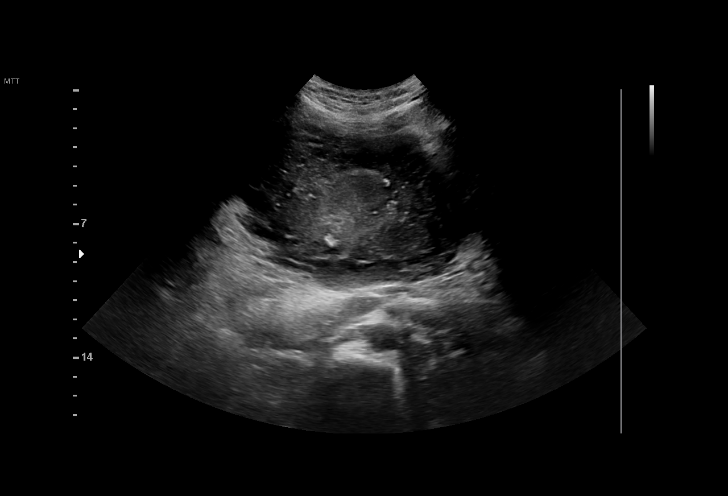
[im 41/55]
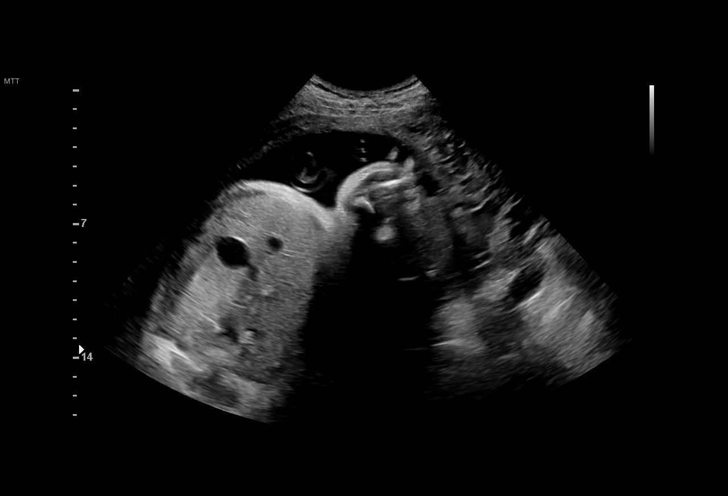
[im 45/55]
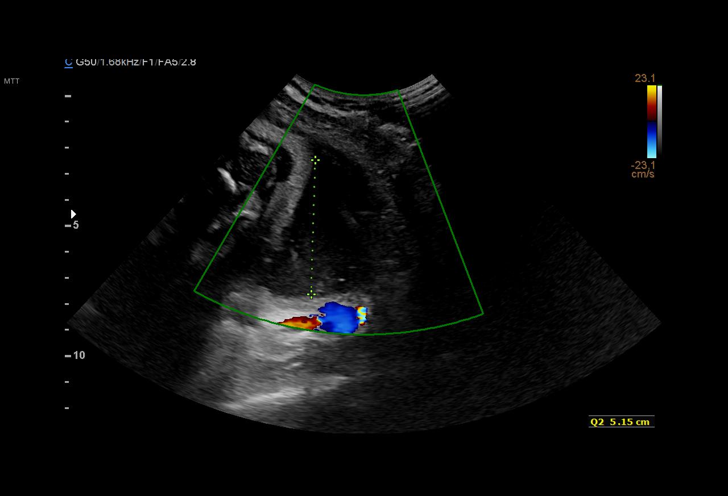
[im 49/55]
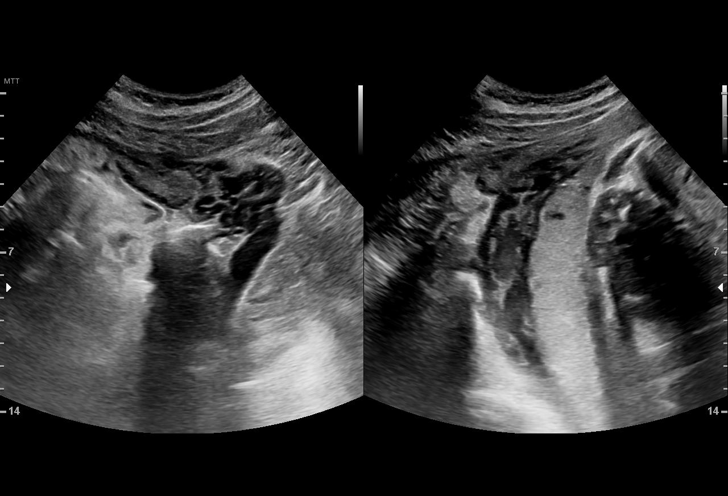
[im 53/55]
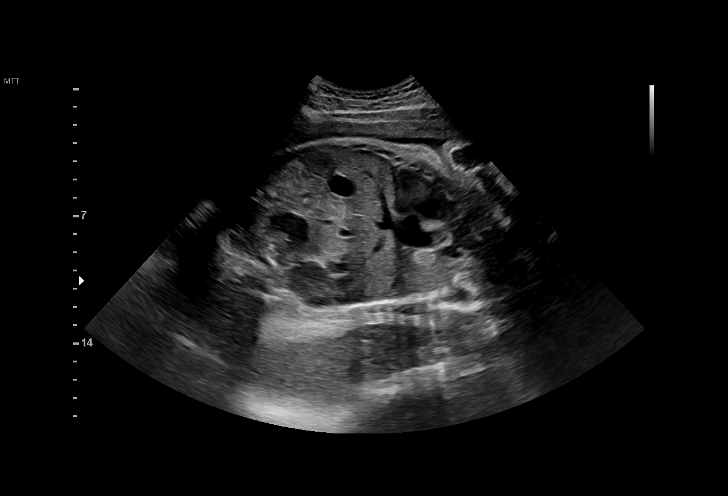

[13 of 28 positions shown; findings below may reference images not displayed]

----------------------------------------------------------------------

 ----------------------------------------------------------------------
Indications

  36 weeks gestation of pregnancy
  Poor obstetric history: Previous IUFD
  (stillbirth) (28 w)
  Encounter for other antenatal screening
  follow-up
  Genetic carrier (Alia Tiger)
  Poor obstetric history: Previous IUFD
  (stillbirth) (28 w)
  Encounter for other antenatal screening
  follow-up
  Genetic carrier (Alia Tiger)
 ----------------------------------------------------------------------
Vital Signs

                                                Height:        5'4"
Fetal Evaluation

 Num Of Fetuses:         1
 Fetal Heart Rate(bpm):  151
 Cardiac Activity:       Observed
 Presentation:           Cephalic
 Placenta:               Posterior
 P. Cord Insertion:      Previously Visualized

 Amniotic Fluid
 AFI FV:      Within normal limits

 AFI Sum(cm)     %Tile       Largest Pocket(cm)
 16.59           62
 RUQ(cm)       RLQ(cm)       LUQ(cm)        LLQ(cm)

Biophysical Evaluation

 Amniotic F.V:   Pocket => 2 cm             F. Tone:        Observed
 F. Movement:    Observed                   Score:          [DATE]
 F. Breathing:   Observed
Biometry

 BPD:      88.3  mm     G. Age:  35w 5d         37  %    CI:        79.82   %    70 - 86
                                                         FL/HC:      21.5   %    20.8 -
 HC:      312.3  mm     G. Age:  35w 0d          3  %    HC/AC:      0.97        0.92 -
 AC:      322.1  mm     G. Age:  36w 1d         49  %    FL/BPD:     75.9   %    71 - 87
 FL:         67  mm     G. Age:  34w 3d          6  %    FL/AC:      20.8   %    20 - 24

 LV:        2.3  mm

 Est. FW:    0683  gm    5 lb 15 oz      26  %
OB History

 Gravidity:    6         Term:   1        Prem:   1        SAB:   1
 TOP:          2        Living:  1
Gestational Age

 U/S Today:     35w 2d                                        EDD:   09/30/19
 Best:          36w 4d     Det. By:  Previous Ultrasound      EDD:   09/21/19
                                     (02/06/19)
Anatomy

 Cranium:               Appears normal         Aortic Arch:            Previously seen
 Cavum:                 Previously seen        Ductal Arch:            Previously seen
 Ventricles:            Appears normal         Diaphragm:              Appears normal
 Choroid Plexus:        Previously seen        Stomach:                Appears normal, left
                                                                       sided
 Cerebellum:            Previously seen        Abdomen:                Previously seen
 Posterior Fossa:       Previously seen        Abdominal Wall:         Previously seen
 Nuchal Fold:           Previously seen        Cord Vessels:           Previously seen
 Face:                  Orbits and profile     Kidneys:                Appear normal
                        previously seen
 Lips:                  Previously seen        Bladder:                Appears normal
 Thoracic:              Previously seen        Spine:                  Previously seen
 Heart:                 Previously seen        Upper Extremities:      Previously seen
 RVOT:                  Previously seen        Lower Extremities:      Previously seen
 LVOT:                  Previously seen

 Other:  Male gender previously seen.  Technically difficult due to advanced
         GA and fetal position.
Cervix Uterus Adnexa

 Cervix
 Not visualized (advanced GA >49wks)

 Uterus
 No abnormality visualized.
 Left Ovary
 Within normal limits. No adnexal mass visualized.

 Right Ovary
 Within normal limits. No adnexal mass visualized.

 Cul De Sac
 No free fluid seen.

 Adnexa
 No abnormality visualized.
Impression

 History of unexplained at stillbirth in 0771 at 28 weeks'
 gestation. Subsequently, in 2377, she delivered at 37
 weeks(?) after confirmation of fetal lung maturity on
 amniocentesis.
 Fetal growth is appropriate for gestational age. Amniotic fluid
 is normal and good fetal activity is seen. Antenatal testing is
 reassuring. BPP [DATE].
 Patient is anxious about continuation of pregnancy to 39
 weeks. I informed her that we do not recommend
 amniocentesis for pulmonary maturity now. I reassured her of
 antenatal testing and recommended we continue weekly
 antenatal testing till delivery.
 If patient is extremely anxious, it is reasonable to deliver at 38
 weeks' gestation.
Recommendations

 -Continue weekly BPP till delivery.
                 Ayaganov, Damegul

## 2021-03-20 ENCOUNTER — Other Ambulatory Visit: Payer: Self-pay | Admitting: Family Medicine

## 2021-03-20 ENCOUNTER — Ambulatory Visit (INDEPENDENT_AMBULATORY_CARE_PROVIDER_SITE_OTHER): Payer: Medicaid Other | Admitting: Family Medicine

## 2021-03-20 ENCOUNTER — Other Ambulatory Visit: Payer: Self-pay

## 2021-03-20 ENCOUNTER — Encounter: Payer: Self-pay | Admitting: Family Medicine

## 2021-03-20 VITALS — BP 118/78 | HR 76 | Wt 153.0 lb

## 2021-03-20 DIAGNOSIS — N6320 Unspecified lump in the left breast, unspecified quadrant: Secondary | ICD-10-CM

## 2021-03-20 NOTE — Progress Notes (Signed)
   GYNECOLOGY OFFICE VISIT NOTE  History:   Cynthia Wong is a 35 y.o. [id number] here today for breast concern.  Reports finding lump in L breast about one month ago Not painful No nipple discharge Not currently breastfeeding Two maternal aunts have had breast cancer, she thinks in their late 38's to early 46's No family hx of ovarian or uterine cancer  Health Maintenance Due  Topic Date Due   COVID-19 Vaccine (1) Never done   Hepatitis C Screening  Never done   INFLUENZA VACCINE  02/02/2021    Past Medical History:  Diagnosis Date   Bronchitis    Hemorrhoid    Vaginal Pap smear, abnormal    colposcopy    Past Surgical History:  Procedure Laterality Date   NO PAST SURGERIES      The following portions of the patient's history were reviewed and updated as appropriate: allergies, current medications, past family history, past medical history, past social history, past surgical history and problem list.   Health Maintenance:   Last pap: Lab Results  Component Value Date   DIAGPAP  03/08/2019    NEGATIVE FOR INTRAEPITHELIAL LESIONS OR MALIGNANCY.   HPV NOT Detected 03/08/2019     Last mammogram:  N/a    Review of Systems:  Pertinent items noted in HPI and remainder of comprehensive ROS otherwise negative.  Physical Exam:  BP 118/78   Pulse 76   Wt 153 lb (69.4 kg)   LMP 03/03/2021   BMI 26.26 kg/m  CONSTITUTIONAL: Well-developed, well-nourished female in no acute distress.  HEENT:  Normocephalic, atraumatic. External right and left ear normal. No scleral icterus.  NECK: Normal range of motion, supple, no masses noted on observation SKIN: No rash noted. Not diaphoretic. No erythema. No pallor. MUSCULOSKELETAL: Normal range of motion. No edema noted. NEUROLOGIC: Alert and oriented to person, place, and time. Normal muscle tone coordination.  PSYCHIATRIC: Normal mood and affect. Normal behavior. Normal judgment and thought content. RESPIRATORY: Effort  normal, no problems with respiration noted BREAST: L breast with ovoid, firm, mobile, 2-3 cm mass just below the nipple. Remainder of bilateral breast exam including axilla unremarkable.   Labs and Imaging No results found for this or any previous visit (from the past 168 hour(s)). No results found.    Assessment and Plan:   Problem List Items Addressed This Visit       Other   Left breast mass - Primary    Palpable mass in L breast just below the nipple. Ovoid, round, mobile, no axillary adenopathy, discussed with patient that these features are reassuring but we will need to obtain further imaging. Referral made for [place] at breast center, advised they will likely add on a mammography as well.       Relevant Orders   US BREAST COMPLETE UNI LEFT INC AXILLA    Routine preventative health maintenance measures emphasized. Please refer to After Visit Summary for other counseling recommendations.   Return for once imaging results are available.    Total face-to-face time with patient: 20 minutes.  Over 50% of encounter was spent on counseling and coordination of care.   Venora Maples, MD/MPH Attending Family Medicine Physician, [facility] for [facility], [facility]

## 2021-03-20 NOTE — Assessment & Plan Note (Signed)
Palpable mass in L breast just below the nipple. Ovoid, round, mobile, no axillary adenopathy, discussed with patient that these features are reassuring but we will need to obtain further imaging. Referral made for [place] at breast center, advised they will likely add on a mammography as well.

## 2021-04-27 ENCOUNTER — Other Ambulatory Visit: Payer: Self-pay

## 2021-04-27 ENCOUNTER — Ambulatory Visit
Admission: RE | Admit: 2021-04-27 | Discharge: 2021-04-27 | Disposition: A | Discharge status: Home / Self Care | Payer: Medicaid Other | Source: Ambulatory Visit | Attending: Family Medicine | Admitting: Family Medicine

## 2021-04-27 ENCOUNTER — Other Ambulatory Visit: Payer: Self-pay | Admitting: Family Medicine

## 2021-04-27 DIAGNOSIS — N6320 Unspecified lump in the left breast, unspecified quadrant: Secondary | ICD-10-CM

## 2021-04-27 DIAGNOSIS — R922 Inconclusive mammogram: Secondary | ICD-10-CM | POA: Diagnosis not present

## 2021-04-27 DIAGNOSIS — Z803 Family history of malignant neoplasm of breast: Secondary | ICD-10-CM | POA: Diagnosis not present

## 2021-04-29 ENCOUNTER — Other Ambulatory Visit: Payer: Self-pay

## 2021-04-29 ENCOUNTER — Other Ambulatory Visit (HOSPITAL_COMMUNITY)
Admission: RE | Admit: 2021-04-29 | Discharge: 2021-04-29 | Disposition: A | Discharge status: Home / Self Care | Payer: Medicaid Other | Source: Ambulatory Visit | Attending: Family Medicine | Admitting: Family Medicine

## 2021-04-29 ENCOUNTER — Encounter: Payer: Self-pay | Admitting: Family Medicine

## 2021-04-29 ENCOUNTER — Ambulatory Visit: Payer: Medicaid Other | Admitting: Family Medicine

## 2021-04-29 VITALS — BP 117/77 | HR 85 | Ht 64.0 in | Wt 155.0 lb

## 2021-04-29 DIAGNOSIS — N6342 Unspecified lump in left breast, subareolar: Secondary | ICD-10-CM | POA: Diagnosis not present

## 2021-04-29 DIAGNOSIS — Z01419 Encounter for gynecological examination (general) (routine) without abnormal findings: Secondary | ICD-10-CM

## 2021-04-29 DIAGNOSIS — R053 Chronic cough: Secondary | ICD-10-CM

## 2021-04-29 DIAGNOSIS — R5383 Other fatigue: Secondary | ICD-10-CM | POA: Diagnosis not present

## 2021-04-29 DIAGNOSIS — Z113 Encounter for screening for infections with a predominantly sexual mode of transmission: Secondary | ICD-10-CM

## 2021-04-29 NOTE — Progress Notes (Signed)
Patient notice lump in left breast approx two months ago. Armandina Stammer, RN

## 2021-04-29 NOTE — Progress Notes (Signed)
GYNECOLOGY ANNUAL PREVENTATIVE CARE ENCOUNTER NOTE  Subjective:   Cynthia Wong is a 35 y.o. [phone] female here for a routine annual gynecologic exam.  Current complaints: Breast lump about 2 months ago. This was imaged. Breast biopsy scheduled in 2 weeks. Still having chronic dry cough. Has tried acid reducers without help. Having some fatigue.  Denies abnormal vaginal bleeding, discharge, pelvic pain, problems with intercourse or other gynecologic concerns.    Gynecologic History Patient's last menstrual period was 04/23/2021. Patient is sexually active  Contraception: IUD Last Pap: 03/2019. Results were: normal Last mammogram: this month - had diagnostic US.  Obstetric History OB History  Gravida Para Term Preterm AB Living  $Remov'6 3 2 1 3 2  'nVLqLt$ SAB IAB Ectopic Multiple Live Births  1 2 0 0 2    # Outcome Date GA Lbr Len/2nd Weight Sex Delivery Anes PTL Lv  6 Term 09/04/19 [redacted]w[redacted]d 04:30 / 00:08 6 lb 7.2 oz (2.926 kg) M Vag-Spont Local  LIV  5 IAB 2015          4 IAB 2014             Birth Comments: D&E  3 SAB 2012          2 Term 02/11/10 [redacted]w[redacted]d   M Vag-Spont     1 Preterm 2006 [redacted]w[redacted]d    Vag-Spont   FD    Past Medical History:  Diagnosis Date   Bronchitis    Hemorrhoid    Vaginal Pap smear, abnormal    colposcopy    Past Surgical History:  Procedure Laterality Date   NO PAST SURGERIES      Current Outpatient Medications on File Prior to Visit  Medication Sig Dispense Refill   [DISCONTINUED] fluticasone (FLOVENT HFA) 44 MCG/ACT inhaler Inhale 1 puff into the lungs 2 (two) times daily. 1 Inhaler 12   No current facility-administered medications on file prior to visit.    No Known Allergies  Social History   Socioeconomic History   Marital status: Significant Other    Spouse name: Not on file   Number of children: Not on file   Years of education: Not on file   Highest education level: Not on file  Occupational History   Not on file  Tobacco Use   Smoking  status: Never   Smokeless tobacco: Never  Vaping Use   Vaping Use: Never used  Substance and Sexual Activity   Alcohol use: Not Currently   Drug use: No   Sexual activity: Yes    Birth control/protection: None  Other Topics Concern   Not on file  Social History Narrative   Not on file   Social Determinants of Health   Financial Resource Strain: Not on file  Food Insecurity: Not on file  Transportation Needs: Not on file  Physical Activity: Not on file  Stress: Not on file  Social Connections: Not on file  Intimate Partner Violence: Not on file    Family History  Problem Relation Age of Onset   Hypertension Mother    Diabetes Mother    Breast cancer Maternal Aunt    Breast cancer Maternal Aunt    Breast cancer Maternal Grandmother    Cancer Maternal Grandmother        pancreas   Cancer Paternal Grandmother    Asthma Brother     The following portions of the patient's history were reviewed and updated as appropriate: allergies, current medications, past family history, past medical  history, past social history, past surgical history and problem list.  Review of Systems Pertinent items are noted in HPI.   Objective:  BP 117/77   Pulse 85   Ht $R'5\' 4"'xy$  (1.626 m)   Wt 155 lb (70.3 kg)   LMP 04/23/2021   BMI 26.61 kg/m  Wt Readings from Last 3 Encounters:  04/29/21 155 lb (70.3 kg)  03/20/21 153 lb (69.4 kg)  01/01/21 151 lb (68.5 kg)     Chaperone present during exam  CONSTITUTIONAL: Well-developed, well-nourished female in no acute distress.  HENT:  Normocephalic, atraumatic, External right and left ear normal. Oropharynx is clear and moist EYES: Conjunctivae and EOM are normal. Pupils are equal, round, and reactive to light. No scleral icterus.  NECK: Normal range of motion, supple, no masses.  Normal thyroid.   CARDIOVASCULAR: Normal heart rate noted, regular rhythm RESPIRATORY: Clear to auscultation bilaterally. Effort and breath sounds normal, no problems  with respiration noted. BREASTS: Symmetric in size. 3cm breast lump felt. No skin changes, nipple drainage, or lymphadenopathy. ABDOMEN: Soft, normal bowel sounds, no distention noted.  No tenderness, rebound or guarding.  PELVIC: Normal appearing external genitalia; normal appearing vaginal mucosa and cervix.  No abnormal discharge noted.  Normal uterine size, no other palpable masses, no uterine or adnexal tenderness. MUSCULOSKELETAL: Normal range of motion. No tenderness.  No cyanosis, clubbing, or edema.  2+ distal pulses. SKIN: Skin is warm and dry. No rash noted. Not diaphoretic. No erythema. No pallor. NEUROLOGIC: Alert and oriented to person, place, and time. Normal reflexes, muscle tone coordination. No cranial nerve deficit noted. PSYCHIATRIC: Normal mood and affect. Normal behavior. Normal judgment and thought content.  Assessment:  Annual gynecologic examination with pap smear   Plan:  1. Well Woman Exam Will follow up results of pap smear and manage accordingly. - Cytology - PAP( [place]) - Thyroid Panel With TSH - Comp Met (CMET) - CBC  2. Screening for STD (sexually transmitted disease) STD screening on PAP  3. Chronic cough Refer to ENT  4. Subareolar mass of left breast Biopsy pending  5. Other fatigue Check labs as below. - Thyroid Panel With TSH - Comp Met (CMET) - CBC   Routine preventative health maintenance measures emphasized. Please refer to After Visit Summary for other counseling recommendations.    Loma Boston, [place] for [employer]

## 2021-04-30 LAB — THYROID PANEL WITH TSH
Free Thyroxine Index: 1.9 (ref 1.2–4.9)
T3 Uptake Ratio: 27 % (ref 24–39)
T4, Total: 7.1 ug/dL (ref 4.5–12.0)
TSH: 0.577 u[IU]/mL (ref 0.450–4.500)

## 2021-04-30 LAB — COMPREHENSIVE METABOLIC PANEL
ALT: 8 IU/L (ref 0–32)
AST: 11 IU/L (ref 0–40)
Albumin/Globulin Ratio: 1.8 (ref 1.2–2.2)
Albumin: 4.4 g/dL (ref 3.8–4.8)
Alkaline Phosphatase: 59 IU/L (ref 44–121)
BUN/Creatinine Ratio: 13 (ref 9–23)
BUN: 9 mg/dL (ref 6–20)
Bilirubin Total: 0.5 mg/dL (ref 0.0–1.2)
CO2: 21 mmol/L (ref 20–29)
Calcium: 9.5 mg/dL (ref 8.7–10.2)
Chloride: 103 mmol/L (ref 96–106)
Creatinine, Ser: 0.72 mg/dL (ref 0.57–1.00)
Globulin, Total: 2.5 g/dL (ref 1.5–4.5)
Glucose: 76 mg/dL (ref 70–99)
Potassium: 4.5 mmol/L (ref 3.5–5.2)
Sodium: 138 mmol/L (ref 134–144)
Total Protein: 6.9 g/dL (ref 6.0–8.5)
eGFR: 111 mL/min/{1.73_m2} (ref >59)

## 2021-04-30 LAB — CBC
Hematocrit: 42.1 % (ref 34.0–46.6)
Hemoglobin: 14 g/dL (ref 11.1–15.9)
MCH: 28.9 pg (ref 26.6–33.0)
MCHC: 33.3 g/dL (ref 31.5–35.7)
MCV: 87 fL (ref 79–97)
Platelets: 270 10*3/uL (ref 150–450)
RBC: 4.84 x10E6/uL (ref 3.77–5.28)
RDW: 12.3 % (ref 11.7–15.4)
WBC: 4.7 10*3/uL (ref 3.4–10.8)

## 2021-05-01 LAB — CYTOLOGY - PAP
Chlamydia: NEGATIVE
Comment: NEGATIVE
Comment: NEGATIVE
Comment: NORMAL
Diagnosis: NEGATIVE
High risk HPV: NEGATIVE
Neisseria Gonorrhea: NEGATIVE

## 2021-05-05 ENCOUNTER — Other Ambulatory Visit: Payer: Self-pay

## 2021-05-05 ENCOUNTER — Ambulatory Visit
Admission: RE | Admit: 2021-05-05 | Discharge: 2021-05-05 | Disposition: A | Discharge status: Home / Self Care | Payer: Medicaid Other | Source: Ambulatory Visit | Attending: Family Medicine | Admitting: Family Medicine

## 2021-05-05 ENCOUNTER — Other Ambulatory Visit: Payer: Self-pay | Admitting: Family Medicine

## 2021-05-05 DIAGNOSIS — N6342 Unspecified lump in left breast, subareolar: Secondary | ICD-10-CM | POA: Diagnosis not present

## 2021-05-05 DIAGNOSIS — N6320 Unspecified lump in the left breast, unspecified quadrant: Secondary | ICD-10-CM

## 2021-07-07 DIAGNOSIS — N6489 Other specified disorders of breast: Secondary | ICD-10-CM | POA: Diagnosis not present

## 2021-07-08 ENCOUNTER — Encounter: Payer: Self-pay | Admitting: General Practice

## 2021-07-15 ENCOUNTER — Telehealth: Payer: Self-pay | Admitting: Hematology and Oncology

## 2021-07-15 ENCOUNTER — Telehealth: Payer: Self-pay | Admitting: Genetic Counselor

## 2021-07-15 NOTE — Telephone Encounter (Signed)
Scheduled appt per 1/9 referral. Spoke to pt who is aware of appt date and time. Pt is aware to arrive 15 mins early and to bring an updated insurance card to appt.

## 2021-07-15 NOTE — Telephone Encounter (Signed)
Scheduled appt per 1/9 referral. Spoke to pt who is aware of appt date and time. Pt is aware to arrive 15 mins early and to bring an updated insurance card to appt.  °

## 2021-07-20 ENCOUNTER — Other Ambulatory Visit: Payer: Self-pay

## 2021-07-20 ENCOUNTER — Encounter: Payer: Self-pay | Admitting: Plastic Surgery

## 2021-07-20 ENCOUNTER — Ambulatory Visit (INDEPENDENT_AMBULATORY_CARE_PROVIDER_SITE_OTHER): Payer: Self-pay | Admitting: Plastic Surgery

## 2021-07-20 VITALS — BP 137/87 | HR 89 | Ht 64.0 in | Wt 155.0 lb

## 2021-07-20 DIAGNOSIS — N6342 Unspecified lump in left breast, subareolar: Secondary | ICD-10-CM

## 2021-07-20 DIAGNOSIS — Z719 Counseling, unspecified: Secondary | ICD-10-CM

## 2021-07-20 NOTE — Progress Notes (Signed)
° °    Patient ID: Cynthia Wong, female    DOB: 1986/01/01, 36 y.o.   MRN: [id number]   Chief Complaint  Patient presents with   Consult   Breast Problem    The patient is a 36 year old female here for evaluation of her breasts.  She has a sclerosing lesion of the left breast.  It is approximately 3 cm and in the subareolar area.  She is seeing Dr. Marlou Starks and planning on having this excised in the next few weeks.  She has been interested in breast augmentation for several years and wanted to see what would be the best timing.  She is 5 feet 4 inches tall and weighs 155 pounds.  Her current bra size is likely a B cup.  She would like to be a C cup.  She is otherwise in good health.  She has not had any surgery.  I feel the area of concern on the left breast otherwise everything looks and feels normal.   Review of Systems  Constitutional: Negative.   Eyes: Negative.   Respiratory: Negative.    Cardiovascular: Negative.  Negative for leg swelling.  Gastrointestinal: Negative.   Endocrine: Negative.   Genitourinary: Negative.   Musculoskeletal: Negative.   Skin: Negative.   Psychiatric/Behavioral: Negative.     Past Medical History:  Diagnosis Date   Bronchitis    Hemorrhoid    Vaginal Pap smear, abnormal    colposcopy    Past Surgical History:  Procedure Laterality Date   NO PAST SURGERIES       No current outpatient medications on file.   Objective:   There were no vitals filed for this visit.  Physical Exam Vitals reviewed.  Constitutional:      Appearance: Normal appearance.  HENT:     Head: Normocephalic and atraumatic.  Cardiovascular:     Rate and Rhythm: Normal rate.     Pulses: Normal pulses.  Pulmonary:     Effort: Pulmonary effort is normal. No respiratory distress.  Abdominal:     General: There is no distension.     Palpations: Abdomen is soft.     Tenderness: There is no abdominal tenderness.  Skin:    Capillary Refill: Capillary refill takes less  than 2 seconds.  Neurological:     Mental Status: She is alert and oriented to person, place, and time.  Psychiatric:        Mood and Affect: Mood normal.        Behavior: Behavior normal.        Thought Content: Thought content normal.    Assessment & Plan:  Subareolar mass of left breast  Encounter for counseling  The patient is a candidate for bilateral breast augmentation.  I spoke with Dr. Marlou Starks.  I think that based on his approach it is going to be better to do the augmentation once she has healed.  My recommendation is for 3 to 6 months after her lumpectomy surgery.  She is going to come to the office and look at some sizes.  We will plan to see her back after she has healed.  Pictures were obtained of the patient and placed in the chart with the patient's or guardian's permission.   [place], DO

## 2021-07-21 ENCOUNTER — Ambulatory Visit: Payer: Self-pay | Admitting: General Surgery

## 2021-07-23 ENCOUNTER — Inpatient Hospital Stay: Payer: Medicaid Other | Attending: Family | Admitting: Genetic Counselor

## 2021-07-23 ENCOUNTER — Other Ambulatory Visit: Payer: Self-pay | Admitting: Genetic Counselor

## 2021-07-23 ENCOUNTER — Other Ambulatory Visit: Payer: Self-pay

## 2021-07-23 ENCOUNTER — Inpatient Hospital Stay: Payer: Medicaid Other

## 2021-07-23 DIAGNOSIS — Z803 Family history of malignant neoplasm of breast: Secondary | ICD-10-CM

## 2021-07-23 DIAGNOSIS — Z800 Family history of malignant neoplasm of digestive organs: Secondary | ICD-10-CM | POA: Diagnosis not present

## 2021-07-24 ENCOUNTER — Ambulatory Visit (INDEPENDENT_AMBULATORY_CARE_PROVIDER_SITE_OTHER): Payer: Medicaid Other

## 2021-07-24 ENCOUNTER — Other Ambulatory Visit (HOSPITAL_COMMUNITY)
Admission: RE | Admit: 2021-07-24 | Discharge: 2021-07-24 | Disposition: A | Discharge status: Home / Self Care | Payer: Medicaid Other | Source: Ambulatory Visit | Attending: Family Medicine | Admitting: Family Medicine

## 2021-07-24 VITALS — BP 109/71 | HR 74 | Wt 155.0 lb

## 2021-07-24 DIAGNOSIS — N898 Other specified noninflammatory disorders of vagina: Secondary | ICD-10-CM | POA: Insufficient documentation

## 2021-07-24 NOTE — Progress Notes (Signed)
SUBJECTIVE:  36 y.o. female complains of white vaginal discharge for 1 week(s). Denies abnormal vaginal bleeding or significant pelvic pain or fever. No UTI symptoms. Denies history of known exposure to STD.  Patient's last menstrual period was 07/09/2021.  OBJECTIVE:  She appears well, afebrile. Urine dipstick: not done.  ASSESSMENT:  Vaginal Discharge     PLAN:  GC, chlamydia, trichomonas, BVAG, CVAG probe sent to lab. Treatment: To be determined once lab results are received ROV prn if symptoms persist or worsen.  Benjamim Harnish l Adaijah Endres, CMA

## 2021-07-24 NOTE — Progress Notes (Signed)
Chart reviewed - agree with CMA/RN documentation.  ° °

## 2021-07-27 ENCOUNTER — Telehealth: Payer: Self-pay

## 2021-07-27 DIAGNOSIS — B379 Candidiasis, unspecified: Secondary | ICD-10-CM

## 2021-07-27 DIAGNOSIS — A599 Trichomoniasis, unspecified: Secondary | ICD-10-CM

## 2021-07-27 LAB — CERVICOVAGINAL ANCILLARY ONLY
Bacterial Vaginitis (gardnerella): NEGATIVE
Candida Glabrata: NEGATIVE
Candida Vaginitis: POSITIVE — AB
Chlamydia: NEGATIVE
Comment: NEGATIVE
Comment: NEGATIVE
Comment: NEGATIVE
Comment: NEGATIVE
Comment: NEGATIVE
Comment: NORMAL
Neisseria Gonorrhea: NEGATIVE
Trichomonas: POSITIVE — AB

## 2021-07-27 MED ORDER — METRONIDAZOLE 500 MG PO TABS
500.0000 mg | ORAL_TABLET | Freq: Two times a day (BID) | ORAL | 0 refills | Status: DC
Start: 2021-07-27 — End: 2021-08-20

## 2021-07-27 MED ORDER — FLUCONAZOLE 150 MG PO TABS: ORAL_TABLET | ORAL | 1 refills | Status: DC

## 2021-07-27 NOTE — Telephone Encounter (Signed)
Pt called requesting results. Pt made aware that she tested positive for Trich and yeast. Pt also made aware that her partner will need to be treated at the health Dept and she and partner will need to not have sex for 7-10 days after they both have been treated. Flagyl 500 mg 1 tab PO bid x 7 days and Diflucan 150 mg 1 tab PO once was sent to her pharmacy. Understanding was voiced. STD form was faxed to [facility]. Yale Golla l Rally Ouch, CMA

## 2021-07-31 ENCOUNTER — Encounter: Payer: Self-pay | Admitting: Genetic Counselor

## 2021-07-31 ENCOUNTER — Ambulatory Visit: Payer: Medicaid Other | Admitting: Family

## 2021-07-31 DIAGNOSIS — Z1379 Encounter for other screening for genetic and chromosomal anomalies: Secondary | ICD-10-CM | POA: Insufficient documentation

## 2021-07-31 DIAGNOSIS — Z800 Family history of malignant neoplasm of digestive organs: Secondary | ICD-10-CM

## 2021-07-31 DIAGNOSIS — Z803 Family history of malignant neoplasm of breast: Secondary | ICD-10-CM

## 2021-07-31 HISTORY — DX: Family history of malignant neoplasm of digestive organs: Z80.0

## 2021-07-31 HISTORY — DX: Family history of malignant neoplasm of breast: Z80.3

## 2021-07-31 NOTE — Progress Notes (Signed)
REFERRING PROVIDER: Jovita Kussmaul, [address] [place],  [place] [address]  PRIMARY PROVIDER:  Pcp, No  PRIMARY REASON FOR VISIT:  1. Family history of breast cancer   2. Family history of pancreatic cancer     HISTORY OF PRESENT ILLNESS:   Cynthia Wong, a 36 y.o. female, was seen for a [facility] cancer genetics consultation at the request of Dr. Marlou Starks due to a family history of breast and pancreatic cancer.  Cynthia Wong presents to clinic today to discuss the possibility of a hereditary predisposition to cancer, to discuss genetic testing, and to further clarify her future cancer risks, as well as potential cancer risks for family members.   Cynthia Wong is a 36 y.o. female with no personal history of cancer.    CANCER HISTORY:  Oncology History   No history exists.    RISK FACTORS:  Menarche was at age 74 or 34.  First live birth at age 46.  OCP use for less than 1 year.  Ovaries intact: yes.  Hysterectomy: no.  Menopausal status: premenopausal.  HRT use: 0 years. Colonoscopy: no; not examined. Mammogram within the last year: yes. 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  Significant diagnoses are listed below: Family History  Problem Relation Age of Onset   Lung cancer Father        d. late 81s   Breast cancer Maternal Aunt        dx late 42s   Breast cancer Maternal Aunt        dx mid 54s   Breast cancer Paternal Aunt         dx 62s   Breast cancer Maternal Grandmother        dx 25s   Pancreatic cancer Maternal Grandmother        dx 27s   Cancer Paternal Grandmother        unknown type; dx 50s-60s   Cervical cancer Cousin        maternal cousin    [name] is unaware of previous family history of genetic testing for hereditary cancer risks. There is no reported Ashkenazi Jewish ancestry. There is no known consanguinity.  GENETIC COUNSELING ASSESSMENT: Cynthia Wong is a 36 y.o. female with a family history of cancer which is somewhat suggestive of a hereditary cancer syndrom and predisposition to cancer given the presence of related cancers in the family and the ages of diagnosis. We, therefore, discussed and recommended the following at today's visit.   DISCUSSION: We discussed that 5 - 10% of cancer is hereditary, with most cases of hereditary breast cancer associated with mutations in BRCA1/2.  There are other genes that can be associated with hereditary breast and pancreatic cancer syndromes.  We discussed that testing is beneficial for several reasons, including knowing about other cancer risks, identifying potential screening and risk-reduction options that may be appropriate, and to understanding if other  family members could be at risk for cancer and allowing them to undergo genetic testing.  We reviewed the characteristics, features and inheritance patterns of hereditary cancer syndromes. We also discussed genetic testing, including the appropriate family members to test, the process of testing, insurance coverage and turn-around-time for results. We discussed the implications of a negative, positive, carrier and/or variant of uncertain significant result. We discussed that negative results would be uninformative given that Cynthia Wong does not have a personal history of cancer. We recommended Cynthia Wong pursue genetic testing for a panel that contains genes associated with breast and pancreatic  cancers.  Based on Cynthia Wong's maternal family history of cancer, she meets medical criteria for genetic testing.   We discussed that some people do not want to undergo genetic testing due to fear of genetic discrimination.  A federal law called the Genetic Information Non-Discrimination Act (GINA) of 2008 helps protect individuals against genetic discrimination based on their genetic test results.  It impacts both health insurance and employment.  With health insurance, it protects against increased premiums, being kicked off insurance or being forced to take a test in order to be insured.  For employment it protects against hiring, firing and promoting decisions based on genetic test results.  GINA does not apply to those in the [employer], those who work for companies with less than 15 employees, and new life insurance or long-term disability insurance policies.  Health status due to a cancer diagnosis is not protected under GINA.  Cynthia Wong has been determined to be at high risk for breast cancer.  Her lifetime risk for breast cancer based on Tyrer-Cuzick risk model is 37%.  This risk estimate can change over time and may be repeated to reflect new information in her personal or family history in the future.  For females with a greater than 20% lifetime risk of breast cancer, the [employer]  (NCCN) recommends the following:  Clinical encounter every 6-12 months to begin when identified as being at increased risk, but not before age 14  Annual mammograms. Tomosynthesis is recommended starting 10 years earlier than the youngest breast cancer diagnosis in the family or at age 7 (whichever comes first), but not before age 28   Annual breast MRI starting 10 years earlier than the youngest breast cancer diagnosis in the family or at age 78 (whichever comes first), but not before age 45      We, therefore, discussed that it is reasonable for Cynthia Wong to be followed by a  high-risk breast cancer clinic; in addition to a yearly mammogram and physical exam by a healthcare provider, she should discuss the usefulness of an annual breast MRI with  the high-risk clinic providers.  Cynthia Wong is already scheduled to be seen in the high risk breast clinic by Dr. Chryl Heck on February 2nd.   PLAN: Cynthia Wong did not wish to pursue genetic testing at today's visit due to GINA considers.  She is interested in genetic testing in the future and stated she will contact me when she is ready for testing. We, therefore, recommend Cynthia Wong continue to follow the cancer screening guidelines given by her providers.  Lastly, we encouraged Cynthia Wong to remain in contact with cancer genetics annually so that we can continuously update the family history and inform her of any changes in cancer genetics and testing that may be of benefit for this family.   Cynthia Wong questions were answered to her satisfaction today. Our  contact information was provided should additional questions or concerns arise. Thank you for the referral and allowing [place] to share in the care of your patient.   Cynthia Wong, Cynthia Wong, [facility] Genetic Counselor [email or web address] (P) [phone]   The patient was seen for a total of 40 minutes in face-to-face genetic counseling.  The patient was seen alone.  Drs. Magrinat, Lindi Adie and/or Burr Medico were available to discuss this case as needed.  _______________________________________________________________________ For Office Staff:  Number of people involved in session: 1 Was an Intern/ student involved with case: no

## 2021-08-06 ENCOUNTER — Inpatient Hospital Stay: Payer: Medicaid Other

## 2021-08-06 ENCOUNTER — Inpatient Hospital Stay: Payer: Medicaid Other | Attending: Hematology and Oncology | Admitting: Hematology and Oncology

## 2021-08-06 ENCOUNTER — Other Ambulatory Visit: Payer: Self-pay

## 2021-08-06 ENCOUNTER — Encounter: Payer: Self-pay | Admitting: Hematology and Oncology

## 2021-08-06 VITALS — BP 134/77 | HR 75 | Temp 97.5°F | Resp 18 | Wt 154.2 lb

## 2021-08-06 DIAGNOSIS — Z800 Family history of malignant neoplasm of digestive organs: Secondary | ICD-10-CM | POA: Diagnosis not present

## 2021-08-06 DIAGNOSIS — Z1501 Genetic susceptibility to malignant neoplasm of breast: Secondary | ICD-10-CM | POA: Diagnosis not present

## 2021-08-06 DIAGNOSIS — Z9189 Other specified personal risk factors, not elsewhere classified: Secondary | ICD-10-CM

## 2021-08-06 DIAGNOSIS — Z803 Family history of malignant neoplasm of breast: Secondary | ICD-10-CM

## 2021-08-06 DIAGNOSIS — N6342 Unspecified lump in left breast, subareolar: Secondary | ICD-10-CM | POA: Diagnosis not present

## 2021-08-06 NOTE — Progress Notes (Signed)
[facility] CONSULT NOTE  Patient Care Team: Pcp, No as PCP - General  CHIEF COMPLAINTS/PURPOSE OF CONSULTATION:  Newly diagnosed breast cancer  HISTORY OF PRESENTING ILLNESS:   Cynthia Wong 36 y.o. female is here because of recent diagnosis of high risk of  breast cancer. She was seen by our genetics team Lorelle Formosa and was found to have high risk of breast cancer based on Tyer Cusick model, her lifetime risk was about 37%.  She was hence recommended to follow-up in the high-risk breast clinic.  Patient arrived to the appointment today by herself.  She went to see breast surgeon because of an abnormal mammogram and she was referred to genetics given her family history.  She did not proceed with the genetic testing yet but given her lifetime risk of breast cancer, she is now referred to high risk breast clinic.  Besides the abnormal mammogram back in October 2022 which showed indeterminate palpable 3.3 cm retroareolar left breast mass which resulted in a biopsy and biopsy confirmed complex sclerosing lesion with intraductal papilloma, she is very healthy at baseline.  She is scheduled for a lumpectomy on February 24.  Maternal grandmother and maternal aunt had breast cancer.  Maternal grandmother also had pancreatic cancer.  No first-degree relatives with breast or pancreatic cancer.  She has 2 children, aged 6 and 2 and is done with childbearing.  Rest of the pertinent 10 point ROS reviewed and negative.  I reviewed her records extensively and collaborated the history with the patient.  SUMMARY OF ONCOLOGIC HISTORY: Oncology History   No history exists.     MEDICAL HISTORY:  Past Medical History:  Diagnosis Date   Bronchitis    Family history of breast cancer 07/31/2021   Family history of pancreatic cancer 07/31/2021   Hemorrhoid    Vaginal Pap smear, abnormal    colposcopy    SURGICAL HISTORY: Past Surgical History:  Procedure Laterality Date   NO  PAST SURGERIES      SOCIAL HISTORY: Social History   Socioeconomic History   Marital status: Single    Spouse name: Not on file   Number of children: Not on file   Years of education: Not on file   Highest education level: Not on file  Occupational History   Not on file  Tobacco Use   Smoking status: Never   Smokeless tobacco: Never  Vaping Use   Vaping Use: Never used  Substance and Sexual Activity   Alcohol use: Not Currently   Drug use: No   Sexual activity: Yes    Birth control/protection: None  Other Topics Concern   Not on file  Social History Narrative   Not on file   Social Determinants of Health   Financial Resource Strain: Not on file  Food Insecurity: Not on file  Transportation Needs: Not on file  Physical Activity: Not on file  Stress: Not on file  Social Connections: Not on file  Intimate Partner Violence: Not on file    FAMILY HISTORY: Family History  Problem Relation Age of Onset   Hypertension Mother    Diabetes Mother    Lung cancer Father        d. late 79s   Asthma Brother    Breast cancer Maternal Aunt        dx late 73s   Breast cancer Maternal Aunt        dx mid 84s   Breast cancer Paternal Aunt  dx 11s   Breast cancer Maternal Grandmother        dx 60s   Pancreatic cancer Maternal Grandmother        dx 15s   Cancer Paternal Grandmother        unknown type; dx 50s-60s   Cervical cancer Cousin        maternal cousin    ALLERGIES:  has No Known Allergies.  MEDICATIONS:  Current Outpatient Medications  Medication Sig Dispense Refill   fluconazole (DIFLUCAN) 150 MG tablet Take 1 tablet by mouth. Repeat in 3 days if symptoms persist. 1 tablet 1   metroNIDAZOLE (FLAGYL) 500 MG tablet Take 1 tablet (500 mg total) by mouth 2 (two) times daily. 14 tablet 0   No current facility-administered medications for this visit.    REVIEW OF SYSTEMS:   Constitutional: Denies fevers, chills or abnormal night sweats Eyes: Denies  blurriness of vision, double vision or watery eyes Ears, nose, mouth, throat, and face: Denies mucositis or sore throat Respiratory: Denies cough, dyspnea or wheezes Cardiovascular: Denies palpitation, chest discomfort or lower extremity swelling Gastrointestinal:  Denies nausea, heartburn or change in bowel habits Skin: Denies abnormal skin rashes Lymphatics: Denies new lymphadenopathy or easy bruising Neurological:Denies numbness, tingling or new weaknesses Behavioral/Psych: Mood is stable, no new changes  Breast: Palpable left breast lump which is being investigated right now All other systems were reviewed with the patient and are negative.  PHYSICAL EXAMINATION: ECOG PERFORMANCE STATUS: 0 - Asymptomatic  Vitals:   08/06/21 1121  BP: 134/77  Pulse: 75  Resp: 18  Temp: (!) 97.5 F (36.4 C)  SpO2: 99%   Filed Weights   08/06/21 1121  Weight: 154 lb 4 oz (70 kg)    Physical exam deferred in lieu of counseling.  Patient had recent breast exam with surgery team.  LABORATORY DATA:  I have reviewed the data as listed Lab Results  Component Value Date   WBC 4.7 04/29/2021   HGB 14.0 04/29/2021   HCT 42.1 04/29/2021   MCV 87 04/29/2021   PLT 270 04/29/2021   Lab Results  Component Value Date   NA 138 04/29/2021   K 4.5 04/29/2021   CL 103 04/29/2021   CO2 21 04/29/2021    RADIOGRAPHIC STUDIES: I have personally reviewed the radiological reports and agreed with the findings in the report.  ASSESSMENT AND PLAN:   This is a very pleasant 36 year old premenopausal woman who was referred to high risk breast clinic given increased lifetime risk of breast cancer and given abnormal mammogram.  We discussed her life time risk of breast cancer and 5 yr risk of breast cancer today which is 37% per [employer] model and 0.4% per Gail's model respectively  2. We discussed different risk reducing strategies for future breast cancer risk today.  At this time she does not qualify  for tamoxifen prevention given low 5-year risk of breast cancer.  3. Lifestyle modification: We discussed different interventions exercise at least 5 days a week including both aerobic as well as weight-bearing exercises and strength training. He discussed dietary modification including increasing number of servings of fruit and vegetables as well as decreased in number of servings of meat. We discussed the importance of maintaining a good BMI and limiting alcohol intake.  4. Surveillance: Patient certainly would be a good candidate for ongoing mammograms on a yearly basis as well as screening MRIs.  We have discussed unknown long-term risk of gadolinium deposition.  We have also  discussed that MRI can lead to additional biopsies because of increased sensitivity.  She is agreeable to MRI, MRI ordered for April 2023  5. Chemoprevention: Does not qualify at this time  6. Genetics: Genetic testing pending  7. Followup: Patient will see me back in mid March to review pathology from lumpectomy.  If no evidence of DCIS or invasive breast malignancy on final pathology, we will monitor her every 6 months with a history and physical examination, screening MRIs versus mammograms.  All questions were answered. The patient knows to call the clinic with any problems, questions or concerns. I spent 45 minutes in the care of this patient including review of records, history and discussion of above-mentioned recommendations.    Benay Pike, MD 08/06/21

## 2021-08-20 ENCOUNTER — Other Ambulatory Visit: Payer: Self-pay

## 2021-08-20 ENCOUNTER — Encounter (HOSPITAL_BASED_OUTPATIENT_CLINIC_OR_DEPARTMENT_OTHER): Payer: Self-pay | Admitting: General Surgery

## 2021-08-27 NOTE — Progress Notes (Addendum)
Sent text reminding pt to come pick up pre surgery drink and a soap. ° ° °Surgical soap given with instructions, pt verbalized understanding. Enhanced Recovery after Surgery  °Enhanced Recovery after Surgery is a protocol used to improve the stress on your body and your recovery after surgery. ° °Patient Instructions ° °The night before surgery:  °No food after midnight. ONLY clear liquids after midnight ° °The day of surgery (if you do NOT have diabetes):  °Drink ONE (1) Pre-Surgery Clear Ensure as directed.   °This drink was given to you during your hospital  °pre-op appointment visit. °The pre-op nurse will instruct you on the time to drink the  °Pre-Surgery Ensure depending on your surgery time. °Finish the drink at the designated time by the pre-op nurse.  °Nothing else to drink after completing the  °Pre-Surgery Clear Ensure. ° °The day of surgery (if you have diabetes): °Drink ONE (1) Gatorade 2 (G2) as directed. °This drink was given to you during your hospital  °pre-op appointment visit.  °The pre-op nurse will instruct you on the time to drink the  ° Gatorade 2 (G2) depending on your surgery time. °Color of the Gatorade may vary. Red is not allowed. °Nothing else to drink after completing the  °Gatorade 2 (G2). ° °       If office.you have questions, please contact your surgeon’s office  °

## 2021-08-28 ENCOUNTER — Ambulatory Visit (HOSPITAL_BASED_OUTPATIENT_CLINIC_OR_DEPARTMENT_OTHER): Payer: Medicaid Other | Admitting: Certified Registered"

## 2021-08-28 ENCOUNTER — Other Ambulatory Visit: Payer: Self-pay

## 2021-08-28 ENCOUNTER — Ambulatory Visit (HOSPITAL_BASED_OUTPATIENT_CLINIC_OR_DEPARTMENT_OTHER)
Admission: RE | Admit: 2021-08-28 | Discharge: 2021-08-28 | Disposition: A | Discharge status: Home / Self Care | Payer: Medicaid Other | Attending: General Surgery | Admitting: General Surgery

## 2021-08-28 ENCOUNTER — Encounter (HOSPITAL_BASED_OUTPATIENT_CLINIC_OR_DEPARTMENT_OTHER)
Admission: RE | Disposition: A | Discharge status: Home / Self Care | Payer: Self-pay | Source: Home / Self Care | Attending: General Surgery

## 2021-08-28 ENCOUNTER — Encounter (HOSPITAL_BASED_OUTPATIENT_CLINIC_OR_DEPARTMENT_OTHER): Payer: Self-pay | Admitting: General Surgery

## 2021-08-28 DIAGNOSIS — Z800 Family history of malignant neoplasm of digestive organs: Secondary | ICD-10-CM | POA: Diagnosis not present

## 2021-08-28 DIAGNOSIS — N6082 Other benign mammary dysplasias of left breast: Secondary | ICD-10-CM | POA: Diagnosis not present

## 2021-08-28 DIAGNOSIS — N6489 Other specified disorders of breast: Secondary | ICD-10-CM | POA: Insufficient documentation

## 2021-08-28 DIAGNOSIS — Z803 Family history of malignant neoplasm of breast: Secondary | ICD-10-CM | POA: Diagnosis not present

## 2021-08-28 DIAGNOSIS — N6022 Fibroadenosis of left breast: Secondary | ICD-10-CM | POA: Diagnosis not present

## 2021-08-28 DIAGNOSIS — N62 Hypertrophy of breast: Secondary | ICD-10-CM | POA: Diagnosis not present

## 2021-08-28 HISTORY — PX: BREAST LUMPECTOMY: SHX2

## 2021-08-28 HISTORY — DX: Family history of other specified conditions: Z84.89

## 2021-08-28 LAB — POCT PREGNANCY, URINE: Preg Test, Ur: NEGATIVE

## 2021-08-28 SURGERY — BREAST LUMPECTOMY
Anesthesia: General | Site: Breast | Laterality: Left

## 2021-08-28 MED ORDER — PROPOFOL 500 MG/50ML IV EMUL
INTRAVENOUS | Status: DC | PRN
  Administered 2021-08-28: 135 ug/kg/min via INTRAVENOUS

## 2021-08-28 MED ORDER — OXYCODONE HCL 5 MG PO TABS: 5.0000 mg | ORAL_TABLET | Freq: Four times a day (QID) | ORAL | 0 refills | Status: DC | PRN

## 2021-08-28 MED ORDER — FENTANYL CITRATE (PF) 100 MCG/2ML IJ SOLN
INTRAMUSCULAR | Status: AC
Start: 2021-08-28 — End: ?
  Filled 2021-08-28: qty 2

## 2021-08-28 MED ORDER — FENTANYL CITRATE (PF) 100 MCG/2ML IJ SOLN: 25.0000 ug | INTRAMUSCULAR | Status: DC | PRN

## 2021-08-28 MED ORDER — FENTANYL CITRATE (PF) 100 MCG/2ML IJ SOLN
INTRAMUSCULAR | Status: DC | PRN
  Administered 2021-08-28: 25 ug via INTRAVENOUS
  Administered 2021-08-28 (×2): 50 ug via INTRAVENOUS
  Administered 2021-08-28: 25 ug via INTRAVENOUS

## 2021-08-28 MED ORDER — CEFAZOLIN SODIUM-DEXTROSE 2-4 GM/100ML-% IV SOLN
2.0000 g | INTRAVENOUS | Status: AC
  Administered 2021-08-28: 2 g via INTRAVENOUS

## 2021-08-28 MED ORDER — MIDAZOLAM HCL 2 MG/2ML IJ SOLN
INTRAMUSCULAR | Status: AC
  Filled 2021-08-28: qty 2

## 2021-08-28 MED ORDER — GABAPENTIN 300 MG PO CAPS
ORAL_CAPSULE | ORAL | Status: AC
Start: 2021-08-28 — End: ?
  Filled 2021-08-28: qty 1

## 2021-08-28 MED ORDER — CHLORHEXIDINE GLUCONATE CLOTH 2 % EX PADS: 6.0000 | MEDICATED_PAD | Freq: Once | CUTANEOUS | Status: DC

## 2021-08-28 MED ORDER — ROCURONIUM BROMIDE 10 MG/ML (PF) SYRINGE
PREFILLED_SYRINGE | INTRAVENOUS | Status: AC
  Filled 2021-08-28: qty 10

## 2021-08-28 MED ORDER — CEFAZOLIN SODIUM-DEXTROSE 2-4 GM/100ML-% IV SOLN
INTRAVENOUS | Status: AC
  Filled 2021-08-28: qty 100

## 2021-08-28 MED ORDER — ONDANSETRON HCL 4 MG/2ML IJ SOLN
INTRAMUSCULAR | Status: DC | PRN
  Administered 2021-08-28: 4 mg via INTRAVENOUS

## 2021-08-28 MED ORDER — ACETAMINOPHEN 500 MG PO TABS
1000.0000 mg | ORAL_TABLET | ORAL | Status: AC
  Administered 2021-08-28: 1000 mg via ORAL

## 2021-08-28 MED ORDER — LIDOCAINE 2% (20 MG/ML) 5 ML SYRINGE
INTRAMUSCULAR | Status: DC | PRN
  Administered 2021-08-28: 60 mg via INTRAVENOUS

## 2021-08-28 MED ORDER — BUPIVACAINE-EPINEPHRINE 0.25% -1:200000 IJ SOLN
INTRAMUSCULAR | Status: DC | PRN
  Administered 2021-08-28: 15 mL

## 2021-08-28 MED ORDER — DEXAMETHASONE SODIUM PHOSPHATE 4 MG/ML IJ SOLN
INTRAMUSCULAR | Status: DC | PRN
Start: 2021-08-28 — End: 2021-08-28
  Administered 2021-08-28: 4 mg via INTRAVENOUS

## 2021-08-28 MED ORDER — FENTANYL CITRATE (PF) 100 MCG/2ML IJ SOLN
INTRAMUSCULAR | Status: AC
  Filled 2021-08-28: qty 2

## 2021-08-28 MED ORDER — ACETAMINOPHEN 500 MG PO TABS
ORAL_TABLET | ORAL | Status: AC
Start: 2021-08-28 — End: ?
  Filled 2021-08-28: qty 2

## 2021-08-28 MED ORDER — CELECOXIB 200 MG PO CAPS
200.0000 mg | ORAL_CAPSULE | ORAL | Status: AC
  Administered 2021-08-28: 200 mg via ORAL

## 2021-08-28 MED ORDER — LACTATED RINGERS IV SOLN: INTRAVENOUS | Status: DC

## 2021-08-28 MED ORDER — CELECOXIB 200 MG PO CAPS
ORAL_CAPSULE | ORAL | Status: AC
Start: 2021-08-28 — End: ?
  Filled 2021-08-28: qty 1

## 2021-08-28 MED ORDER — PROPOFOL 10 MG/ML IV BOLUS
INTRAVENOUS | Status: DC | PRN
  Administered 2021-08-28: 150 mg via INTRAVENOUS

## 2021-08-28 MED ORDER — GABAPENTIN 300 MG PO CAPS
300.0000 mg | ORAL_CAPSULE | ORAL | Status: AC
  Administered 2021-08-28: 300 mg via ORAL

## 2021-08-28 MED ORDER — MIDAZOLAM HCL 5 MG/5ML IJ SOLN
INTRAMUSCULAR | Status: DC | PRN
  Administered 2021-08-28: 2 mg via INTRAVENOUS

## 2021-08-28 SURGICAL SUPPLY — 45 items
ADH SKN CLS APL DERMABOND .7 (GAUZE/BANDAGES/DRESSINGS) ×1
APL PRP STRL LF DISP 70% ISPRP (MISCELLANEOUS) ×1
APPLIER CLIP 9.375 MED OPEN (MISCELLANEOUS)
APR CLP MED 9.3 20 MLT OPN (MISCELLANEOUS)
BLADE SURG 15 STRL LF DISP TIS (BLADE) ×1 IMPLANT
BLADE SURG 15 STRL SS (BLADE) ×2
CANISTER SUCT 1200ML W/VALVE (MISCELLANEOUS) ×1 IMPLANT
CHLORAPREP W/TINT 26 (MISCELLANEOUS) ×2 IMPLANT
CLIP APPLIE 9.375 MED OPEN (MISCELLANEOUS) IMPLANT
COVER BACK TABLE 60X90IN (DRAPES) ×2 IMPLANT
COVER MAYO STAND STRL (DRAPES) ×2 IMPLANT
DERMABOND ADVANCED (GAUZE/BANDAGES/DRESSINGS) ×1
DERMABOND ADVANCED .7 DNX12 (GAUZE/BANDAGES/DRESSINGS) ×1 IMPLANT
DRAPE LAPAROSCOPIC ABDOMINAL (DRAPES) ×2 IMPLANT
DRAPE UTILITY XL STRL (DRAPES) ×2 IMPLANT
ELECT BLADE 4.0 EZ CLEAN MEGAD (MISCELLANEOUS)
ELECT COATED BLADE 2.86 ST (ELECTRODE) ×2 IMPLANT
ELECT REM PT RETURN 9FT ADLT (ELECTROSURGICAL) ×2
ELECTRODE BLDE 4.0 EZ CLN MEGD (MISCELLANEOUS) IMPLANT
ELECTRODE REM PT RTRN 9FT ADLT (ELECTROSURGICAL) ×1 IMPLANT
GLOVE SURG ENC MOIS LTX SZ7.5 (GLOVE) ×3 IMPLANT
GLOVE SURG POLYISO LF SZ6.5 (GLOVE) ×1 IMPLANT
GLOVE SURG UNDER POLY LF SZ6.5 (GLOVE) ×1 IMPLANT
GLOVE SURG UNDER POLY LF SZ7 (GLOVE) ×1 IMPLANT
GOWN STRL REUS W/ TWL LRG LVL3 (GOWN DISPOSABLE) ×2 IMPLANT
GOWN STRL REUS W/TWL LRG LVL3 (GOWN DISPOSABLE) ×4
ILLUMINATOR WAVEGUIDE N/F (MISCELLANEOUS) IMPLANT
KIT MARKER MARGIN INK (KITS) ×1 IMPLANT
LIGHT WAVEGUIDE WIDE FLAT (MISCELLANEOUS) IMPLANT
NDL HYPO 25X1 1.5 SAFETY (NEEDLE) ×1 IMPLANT
NEEDLE HYPO 25X1 1.5 SAFETY (NEEDLE) ×2 IMPLANT
NS IRRIG 1000ML POUR BTL (IV SOLUTION) ×1 IMPLANT
PACK BASIN DAY SURGERY FS (CUSTOM PROCEDURE TRAY) ×2 IMPLANT
PENCIL SMOKE EVACUATOR (MISCELLANEOUS) ×2 IMPLANT
SLEEVE SCD COMPRESS KNEE MED (STOCKING) ×2 IMPLANT
SPIKE FLUID TRANSFER (MISCELLANEOUS) ×1 IMPLANT
SPONGE T-LAP 18X18 ~~LOC~~+RFID (SPONGE) ×2 IMPLANT
SUT MON AB 4-0 PC3 18 (SUTURE) ×2 IMPLANT
SUT SILK 2 0 SH (SUTURE) ×1 IMPLANT
SUT VICRYL 3-0 CR8 SH (SUTURE) ×2 IMPLANT
SYR CONTROL 10ML LL (SYRINGE) ×2 IMPLANT
TOWEL GREEN STERILE FF (TOWEL DISPOSABLE) ×2 IMPLANT
TRAY FAXITRON CT DISP (TRAY / TRAY PROCEDURE) ×1 IMPLANT
TUBE CONNECTING 20X1/4 (TUBING) ×1 IMPLANT
YANKAUER SUCT BULB TIP NO VENT (SUCTIONS) ×1 IMPLANT

## 2021-08-28 NOTE — Anesthesia Preprocedure Evaluation (Addendum)
Anesthesia Evaluation  Patient identified by MRN, date of birth, ID band Patient awake    Reviewed: Allergy & Precautions, NPO status , Patient's Chart, lab work & pertinent test results  Airway Mallampati: I  TM Distance: >3 FB Neck ROM: Full    Dental no notable dental hx.    Pulmonary asthma ,    Pulmonary exam normal        Cardiovascular negative cardio ROS   Rhythm:Regular Rate:Normal     Neuro/Psych negative neurological ROS  negative psych ROS   GI/Hepatic negative GI ROS, Neg liver ROS,   Endo/Other  negative endocrine ROS  Renal/GU negative Renal ROS  negative genitourinary   Musculoskeletal Left breast CSL   Abdominal Normal abdominal exam  (+)   Peds  Hematology negative hematology ROS (+)   Anesthesia Other Findings   Reproductive/Obstetrics                            Anesthesia Physical Anesthesia Plan  ASA: 1  Anesthesia Plan: General   Post-op Pain Management: Tylenol PO (pre-op)*, Gabapentin PO (pre-op)* and Celebrex PO (pre-op)*   Induction: Intravenous  PONV Risk Score and Plan: 3 and Ondansetron, Dexamethasone, Midazolam and Treatment may vary due to age or medical condition  Airway Management Planned: Mask and LMA  Additional Equipment: None  Intra-op Plan:   Post-operative Plan: Extubation in OR  Informed Consent: I have reviewed the patients History and Physical, chart, labs and discussed the procedure including the risks, benefits and alternatives for the proposed anesthesia with the patient or authorized representative who has indicated his/her understanding and acceptance.     Dental advisory given  Plan Discussed with: CRNA  Anesthesia Plan Comments:         Anesthesia Quick Evaluation

## 2021-08-28 NOTE — Transfer of Care (Signed)
Immediate Anesthesia Transfer of Care Note  Patient: Cynthia Wong  Procedure(s) Performed: LEFT BREAST LUMPECTOMY (Left: Breast)  Patient Location: PACU  Anesthesia Type:General  Level of Consciousness: drowsy and patient cooperative  Airway & Oxygen Therapy: Patient Spontanous Breathing and Patient connected to face mask oxygen  Post-op Assessment: Report given to RN and Post -op Vital signs reviewed and stable  Post vital signs: Reviewed and stable  Last Vitals:  Vitals Value Taken Time  BP    Temp    Pulse    Resp    SpO2      Last Pain:  Vitals:   08/28/21 1000  TempSrc: Oral  PainSc: 0-No pain      Patients Stated Pain Goal: 3 (08/28/21 1000)  Complications: No notable events documented.

## 2021-08-28 NOTE — Discharge Instructions (Addendum)
Tylenol given at 10:05, no additional until after 2:00pm  Post Anesthesia Home Care Instructions  Activity: Get plenty of rest for the remainder of the day. A responsible individual must stay with you for 24 hours following the procedure.  For the next 24 hours, DO NOT: -Drive a car -Paediatric nurse -Drink alcoholic beverages -Take any medication unless instructed by your physician -Make any legal decisions or sign important papers.  Meals: Start with liquid foods such as gelatin or soup. Progress to regular foods as tolerated. Avoid greasy, spicy, heavy foods. If nausea and/or vomiting occur, drink only clear liquids until the nausea and/or vomiting subsides. Call your physician if vomiting continues.  Special Instructions/Symptoms: Your throat may feel dry or sore from the anesthesia or the breathing tube placed in your throat during surgery. If this causes discomfort, gargle with warm salt water. The discomfort should disappear within 24 hours.  If you had a scopolamine patch placed behind your ear for the management of post- operative nausea and/or vomiting:  1. The medication in the patch is effective for 72 hours, after which it should be removed.  Wrap patch in a tissue and discard in the trash. Wash hands thoroughly with soap and water. 2. You may remove the patch earlier than 72 hours if you experience unpleasant side effects which may include dry mouth, dizziness or visual disturbances. 3. Avoid touching the patch. Wash your hands with soap and water after contact with the patch.

## 2021-08-28 NOTE — Interval H&P Note (Signed)
History and Physical Interval Note:  08/28/2021 10:24 AM  Cynthia Wong  has presented today for surgery, with the diagnosis of LEFT BREAST CSL.  The various methods of treatment have been discussed with the patient and family. After consideration of risks, benefits and other options for treatment, the patient has consented to  Procedure(s): LEFT BREAST LUMPECTOMY (Left) as a surgical intervention.  The patient's history has been reviewed, patient examined, no change in status, stable for surgery.  I have reviewed the patient's chart and labs.  Questions were answered to the patient's satisfaction.     Chevis Pretty III

## 2021-08-28 NOTE — H&P (Signed)
REFERRING PHYSICIAN: Wynne Dust*  PROVIDER: Lindell Noe, MD  MRN: [id number] DOB: January 17, 1986 Subjective   Chief Complaint: Breast Problem (Left breast mass/)   History of Present Illness: Cynthia Wong is a 36 y.o. female who is seen today as an office consultation at the request of Dr. Crissie Reese for evaluation of Breast Problem (Left breast mass/) .   We are asked to see the patient in consultation by Dr. Merian Capron to evaluate her for a complex sclerosing lesion of the left breast. The patient is a 36 year old black female who has felt a mass underneath her areola for several months. She denies any pain or discharge from the nipple. She brought this to the attention of her medical doctor who worked this up. She was found to have a 3 cm mass in the subareolar left breast. This was biopsied and came back as a complex sclerosing lesion. She does have a family history of breast cancer in 2 maternal aunts and a maternal grandmother that also had pancreatic cancer. She is otherwise in good health and does not smoke.  Review of Systems: A complete review of systems was obtained from the patient. I have reviewed this information and discussed as appropriate with the patient. See HPI as well for other ROS.  ROS   Medical History: Past Medical History:  Diagnosis Date   Asthma, unspecified asthma severity, unspecified whether complicated, unspecified whether persistent   Patient Active Problem List  Diagnosis   Complex sclerosing lesion of left breast   History reviewed. No pertinent surgical history.   Not on File  No current outpatient medications on file prior to visit.   No current facility-administered medications on file prior to visit.   Family History  Problem Relation Age of Onset   High blood pressure (Hypertension) Mother   Diabetes Mother    Social History   Tobacco Use  Smoking Status Former   Types: Cigarettes  Smokeless Tobacco Never     Social History   Socioeconomic History   Marital status: Radiation protection practitioner  Tobacco Use   Smoking status: Former  Types: Cigarettes   Smokeless tobacco: Never  Substance and Sexual Activity   Alcohol use: Yes   Drug use: Never   Objective:   Vitals:  BP: 120/72  Pulse: 52  Temp: 36.7 C (98 F)  SpO2: 98%  Weight: 70.2 kg (154 lb 12.8 oz)  Height: 162.6 cm (5\' 4" )   Body mass index is 26.57 kg/m.  Physical Exam Vitals reviewed.  Constitutional:  General: She is not in acute distress. Appearance: Normal appearance.  HENT:  Head: Normocephalic and atraumatic.  Right Ear: External ear normal.  Left Ear: External ear normal.  Nose: Nose normal.  Mouth/Throat:  Mouth: Mucous membranes are moist.  Pharynx: Oropharynx is clear.  Eyes:  General: No scleral icterus. Extraocular Movements: Extraocular movements intact.  Conjunctiva/sclera: Conjunctivae normal.  Pupils: Pupils are equal, round, and reactive to light.  Cardiovascular:  Rate and Rhythm: Normal rate and regular rhythm.  Pulses: Normal pulses.  Heart sounds: Normal heart sounds.  Pulmonary:  Effort: Pulmonary effort is normal. No respiratory distress.  Breath sounds: Normal breath sounds.  Abdominal:  General: Bowel sounds are normal.  Palpations: Abdomen is soft.  Tenderness: There is no abdominal tenderness.  Musculoskeletal:  General: No swelling, tenderness or deformity. Normal range of motion.  Cervical back: Normal range of motion and neck supple.  Skin: General: Skin is warm and dry.  Coloration: Skin is not  jaundiced.  Neurological:  General: No focal deficit present.  Mental Status: She is alert and oriented to person, place, and time.  Psychiatric:  Mood and Affect: Mood normal.  Behavior: Behavior normal.     Breast: There is a 2 to 3 cm palpable mass with well-defined edges that is mobile in the subareolar sort of medial left breast. There is no palpable mass in the right breast.  There is 1 small palpable mobile lymph node in the left axilla but no other palpable lymphadenopathy.  Labs, Imaging and Diagnostic Testing:  Assessment and Plan:   Diagnoses and all orders for this visit:  Complex sclerosing lesion of left breast    The patient appears to have a complex sclerosing lesion in the subareolar left breast measuring about 3 cm. Because of the size of the area involved and because of her family history I would recommend that this mass be removed. She would also like to have this done. I have discussed with her in detail the risks and benefits of the operation as well as some of the technical aspects and she understands and wishes to proceed. Since it is palpable we will not need a seed to localize it. She is also very interested in augmentation and was thinking about going to [facility] for [facility]. I think if this is something she is serious about then there is an advantage to staying local and having her plastic surgeon see her. She is very interested in this. I will refer her to Dr. Ulice Bold and then if she wants to have these done at the same time we can coordinate. Otherwise we will proceed with surgical planning.

## 2021-08-28 NOTE — Anesthesia Procedure Notes (Signed)
Procedure Name: LMA Insertion Date/Time: 08/28/2021 10:37 AM Performed by: Signe Colt, CRNA Pre-anesthesia Checklist: Patient identified, Emergency Drugs available, Suction available and Patient being monitored Patient Re-evaluated:Patient Re-evaluated prior to induction Oxygen Delivery Method: Circle System Utilized Preoxygenation: Pre-oxygenation with 100% oxygen Induction Type: IV induction Ventilation: Mask ventilation without difficulty LMA: LMA inserted LMA Size: 4.0 Number of attempts: 1 Airway Equipment and Method: bite block Placement Confirmation: positive ETCO2 Tube secured with: Tape Dental Injury: Teeth and Oropharynx as per pre-operative assessment

## 2021-08-28 NOTE — Anesthesia Postprocedure Evaluation (Signed)
Anesthesia Post Note  Patient: LAQUENTA WHITSELL  Procedure(s) Performed: LEFT BREAST LUMPECTOMY (Left: Breast)     Patient location during evaluation: PACU Anesthesia Type: General Level of consciousness: awake and alert Pain management: pain level controlled Vital Signs Assessment: post-procedure vital signs reviewed and stable Respiratory status: spontaneous breathing, nonlabored ventilation, respiratory function stable and patient connected to nasal cannula oxygen Cardiovascular status: blood pressure returned to baseline and stable Postop Assessment: no apparent nausea or vomiting Anesthetic complications: no   No notable events documented.  Last Vitals:  Vitals:   08/28/21 1210 08/28/21 1226  BP:  120/74  Pulse: 90 78  Resp: 20 16  Temp:  36.4 C  SpO2: 100% 98%    Last Pain:  Vitals:   08/28/21 1226  TempSrc:   PainSc: 0-No pain                 Earl Lites P Deryn Massengale

## 2021-08-28 NOTE — Op Note (Signed)
08/28/2021  11:23 AM  PATIENT:  Cynthia Wong  36 y.o. female  PRE-OPERATIVE DIAGNOSIS:  LEFT BREAST COMPLEX SCLEROSING LESION  POST-OPERATIVE DIAGNOSIS:  LEFT BREAST COMPLEX SCLEROSING LESION  PROCEDURE:  Procedure(s): LEFT BREAST LUMPECTOMY (Left)  SURGEON:  Surgeon(s) and Role:    Griselda Miner, MD - Primary  PHYSICIAN ASSISTANT:   ASSISTANTS: none   ANESTHESIA:   local and general  EBL:  10 mL   BLOOD ADMINISTERED:none  DRAINS: none   LOCAL MEDICATIONS USED:  MARCAINE     SPECIMEN:  Source of Specimen:  left breast tissue  DISPOSITION OF SPECIMEN:  PATHOLOGY  COUNTS:  YES  TOURNIQUET:  * No tourniquets in log *  DICTATION: .Dragon Dictation  After informed consent was obtained the patient was brought to the operating room and placed in the supine position on the operating table.  After adequate induction of general anesthesia the patient's left breast was prepped with ChloraPrep, allowed to dry, and draped in usual sterile manner.  An appropriate timeout was performed.  The patient has a palpable mass in the medial central left breast that was biopsied and came back as a complex sclerosing lesion.  The mass is palpable.  The area around this was infiltrated with quarter percent Marcaine.  A curvilinear incision was made along the inner aspect of the areola of the left breast with a 15 blade knife.  The incision was carried through the skin and subcutaneous tissue sharply with the electrocautery.  Dissection was then carried through the breast tissue towards the palpable mass.  Once more closely approached the palpable mass I then excised the palpable mass sharply with the electrocautery separating it from the rest of the breast tissue.  Once the mass was removed from the patient it was oriented with the appropriate paint colors.  A specimen radiograph was obtained that showed the clip to be in the center of the tissue that was removed.  The specimen was then sent to  pathology for further evaluation.  Hemostasis was achieved using the Bovie electrocautery.  The wound was irrigated with saline and infiltrated with more quarter percent Marcaine.  The deep layer of the wound was then closed with layers of interrupted 3-0 Vicryl stitches.  The skin was then closed with interrupted 4-0 Monocryl subcuticular stitches.  Dermabond dressings were applied.  The patient tolerated the procedure well.  At the end of the case all needle sponge and instrument counts were correct.  The patient was then awakened and taken to recovery in stable condition.  PLAN OF CARE: Discharge to home after PACU  PATIENT DISPOSITION:  PACU - hemodynamically stable.   Delay start of Pharmacological VTE agent (>24hrs) due to surgical blood loss or risk of bleeding: not applicable

## 2021-08-31 ENCOUNTER — Encounter (HOSPITAL_BASED_OUTPATIENT_CLINIC_OR_DEPARTMENT_OTHER): Payer: Self-pay | Admitting: General Surgery

## 2021-08-31 LAB — SURGICAL PATHOLOGY

## 2021-09-11 ENCOUNTER — Encounter: Payer: Self-pay | Admitting: Family

## 2021-09-11 ENCOUNTER — Ambulatory Visit: Payer: Medicaid Other | Admitting: Family

## 2021-09-11 ENCOUNTER — Other Ambulatory Visit: Payer: Self-pay

## 2021-09-11 ENCOUNTER — Ambulatory Visit (HOSPITAL_BASED_OUTPATIENT_CLINIC_OR_DEPARTMENT_OTHER)
Admission: RE | Admit: 2021-09-11 | Discharge: 2021-09-11 | Disposition: A | Discharge status: Home / Self Care | Payer: Medicaid Other | Source: Ambulatory Visit | Attending: Family | Admitting: Family

## 2021-09-11 VITALS — BP 100/70 | HR 81 | Temp 98.5°F | Ht 64.0 in | Wt 153.8 lb

## 2021-09-11 DIAGNOSIS — R053 Chronic cough: Secondary | ICD-10-CM

## 2021-09-11 DIAGNOSIS — M545 Low back pain, unspecified: Secondary | ICD-10-CM | POA: Diagnosis not present

## 2021-09-11 MED ORDER — MELOXICAM 15 MG PO TABS: 15.0000 mg | ORAL_TABLET | Freq: Every day | ORAL | 0 refills | Status: DC

## 2021-09-11 MED ORDER — FLUTICASONE PROPIONATE 50 MCG/ACT NA SUSP: 2.0000 | Freq: Every day | NASAL | 6 refills | Status: DC

## 2021-09-11 NOTE — Progress Notes (Signed)
?Cynthia Wong is a 36 y.o. female with the following history as recorded in EpicCare:  ?Patient Active Problem List  ? Diagnosis Date Noted  ? Family history of breast cancer 07/31/2021  ? Family history of pancreatic cancer 07/31/2021  ? Genetic testing 07/31/2021  ? Encounter for counseling 07/20/2021  ? Left breast mass 03/20/2021  ? Asthma 09/04/2019  ? Dyspnea 07/25/2019  ? Alpha thalassemia silent carrier 06/04/2019  ? History of stillbirth in currently pregnant patient, unspecified trimester 03/08/2019  ?  ?Current Outpatient Medications  ?Medication Sig Dispense Refill  ? fluticasone (FLONASE) 50 MCG/ACT nasal spray Place 2 sprays into both nostrils daily. 16 g 6  ? levonorgestrel (LILETTA, 52 MG,) 20.1 MCG/DAY IUD 1 each by Intrauterine route once.    ? meloxicam (MOBIC) 15 MG tablet Take 1 tablet (15 mg total) by mouth daily. 30 tablet 0  ? ?No current facility-administered medications for this visit.  ?  ?Allergies: Patient has no known allergies.  ?Past Medical History:  ?Diagnosis Date  ? Bronchitis   ? Family history of adverse reaction to anesthesia   ? mother with nausea  ? Family history of breast cancer 07/31/2021  ? Family history of pancreatic cancer 07/31/2021  ? Hemorrhoid   ? Vaginal Pap smear, abnormal   ? colposcopy  ?  ?Past Surgical History:  ?Procedure Laterality Date  ? BREAST LUMPECTOMY Left 08/28/2021  ? Procedure: LEFT BREAST LUMPECTOMY;  Surgeon: Griselda Miner, MD;  Location: [facility];  Service: General;  Laterality: Left;  ? NO PAST SURGERIES    ?  ?Family History  ?Problem Relation Age of Onset  ? Arthritis Mother   ? Hypertension Mother   ? Diabetes Mother   ? Lung cancer Father   ?     d. late 61s  ? Depression Sister   ? Asthma Brother   ? Asthma Son   ? Breast cancer Maternal Aunt   ?     dx late 69s  ? Breast cancer Maternal Aunt   ?     dx mid 31s  ? Breast cancer Paternal Aunt   ?     dx 10s  ? Breast cancer Maternal Grandmother   ?     dx 39s  ?  Pancreatic cancer Maternal Grandmother   ?     dx 93s  ? Cancer Paternal Grandmother   ?     unknown type; dx 50s-60s  ? Cervical cancer Cousin   ?     maternal cousin  ?  ?Social History  ? ?Tobacco Use  ? Smoking status: Never  ? Smokeless tobacco: Never  ?Substance Use Topics  ? Alcohol use: Yes  ?  Comment: socially  ?  ?Subjective:  ?Chronic cough x 2 years; last CXR done in 2013; requesting referral to pulmonologist;  ?Feels like cough started when she was living in her home that was later found to have mold; does not live in the home anymore but the symptoms have persisted;  ? ?Has tried treating for GERD with no relief; no personal history of asthma- notes she gets bronchitis occasionally; does feel like she has some seasonal allergies- typically uses Sudafed prn;  ? ?Does feel that cough causes her to be winded; had cardiac exam when symptoms first started and heart was clear;  ? ?Also mentions low back pain x 1-2 weeks; started after reaching for young son; no numbness/ tingling;  ? ?LMP last week;  ? ? ?  Objective:  ?Vitals:  ? 09/11/21 1011  ?BP: 100/70  ?Pulse: 81  ?Temp: 98.5 ?F (36.9 ?C)  ?TempSrc: Oral  ?SpO2: 98%  ?Weight: 153 lb 12.8 oz (69.8 kg)  ?Height: 5\' 4"  (1.626 m)  ?  ?General: Well developed, well nourished, in no acute distress  ?Skin : Warm and dry.  ?Head: Normocephalic and atraumatic  ?Eyes: Sclera and conjunctiva clear; pupils round and reactive to light; extraocular movements intact  ?Ears: External normal; canals clear; tympanic membranes normal  ?Oropharynx: Pink, supple. No suspicious lesions  ?Neck: Supple without thyromegaly, adenopathy  ?Lungs: Respirations unlabored; clear to auscultation bilaterally without wheeze, rales, rhonchi  ?CVS exam: normal rate and regular rhythm.  ?Neurologic: Alert and oriented; speech intact; face symmetrical; moves all extremities well; CNII-XII intact without focal deficit  ? ?Assessment:  ?1. Chronic cough   ?2. Acute low back pain without  sciatica, unspecified back pain laterality   ?  ?Plan:  ?? Underlying allergy vs asthma; will update CXR today; trial of Flonase NS; refer to pulmonology;  ?Suspect muscular; Rx for Mobic 15 mg daily; follow up worse, no better;  ? ?This visit occurred during the SARS-CoV-2 public health emergency.  Safety protocols were in place, including screening questions prior to the visit, additional usage of staff PPE, and extensive cleaning of exam room while observing appropriate contact time as indicated for disinfecting solutions.  ? ? ? ?No follow-ups on file.  ?Orders Placed This Encounter  ?Procedures  ? DG Chest 2 View  ?  Standing Status:   Future  ?  Number of Occurrences:   1  ?  Standing Expiration Date:   09/12/2022  ?  Order Specific Question:   Reason for Exam (SYMPTOM  OR DIAGNOSIS REQUIRED)  ?  Answer:   chronic cough  ?  Order Specific Question:   Is patient pregnant?  ?  Answer:   No  ?  Order Specific Question:   Preferred imaging location?  ?  Answer:   [facility]  ? Ambulatory referral to Pulmonology  ?  Referral Priority:   Routine  ?  Referral Type:   Consultation  ?  Referral Reason:   Specialty Services Required  ?  Requested Specialty:   Pulmonary Disease  ?  Number of Visits Requested:   1  ?  ?Requested Prescriptions  ? ?Signed Prescriptions Disp Refills  ? fluticasone (FLONASE) 50 MCG/ACT nasal spray 16 g 6  ?  Sig: Place 2 sprays into both nostrils daily.  ? meloxicam (MOBIC) 15 MG tablet 30 tablet 0  ?  Sig: Take 1 tablet (15 mg total) by mouth daily.  ?  ? ?

## 2021-09-17 ENCOUNTER — Telehealth: Payer: Self-pay | Admitting: Hematology and Oncology

## 2021-09-17 ENCOUNTER — Inpatient Hospital Stay (HOSPITAL_BASED_OUTPATIENT_CLINIC_OR_DEPARTMENT_OTHER): Payer: Medicaid Other | Admitting: Hematology and Oncology

## 2021-09-17 NOTE — Progress Notes (Signed)
[place] ?CONSULT NOTE ? ?Patient Care Team: ?Marrian Salvage, FNP as PCP - General (Internal Medicine) ? ?CHIEF COMPLAINTS/PURPOSE OF CONSULTATION:  ?Newly diagnosed breast cancer ? ?HISTORY OF PRESENTING ILLNESS:  ? ?Cynthia Wong 36 y.o. female is here because of recent diagnosis of high risk of  breast cancer. She was seen by our genetics team Lorelle Formosa and was found to have high risk of breast cancer based on Tyer Cusick model, her lifetime risk was about 37%.  She was hence recommended to follow-up in the high-risk breast clinic. ? ?Patient arrived to the appointment today by herself.  She went to see breast surgeon because of an abnormal mammogram and she was referred to genetics given her family history.  She did not proceed with the genetic testing yet but given her lifetime risk of breast cancer, she is now referred to high risk breast clinic. ? ?Besides the abnormal mammogram back in October 2022 which showed indeterminate palpable 3.3 cm retroareolar left breast mass which resulted in a biopsy and biopsy confirmed complex sclerosing lesion with intraductal papilloma, she is very healthy at baseline.  She is scheduled for a lumpectomy on February 24. ? ?Maternal grandmother and maternal aunt had breast cancer.  Maternal grandmother also had pancreatic cancer.  No first-degree relatives with breast or pancreatic cancer.  She has 2 children, aged 17 and 2 and is done with childbearing. ? ?Rest of the pertinent 10 point ROS reviewed and negative. ? ?I reviewed her records extensively and collaborated the history with the patient. ? ?SUMMARY OF ONCOLOGIC HISTORY: ?Oncology History  ? No history exists.  ? ? ? ?MEDICAL HISTORY:  ?Past Medical History:  ?Diagnosis Date  ?? Bronchitis   ?? Family history of adverse reaction to anesthesia   ? mother with nausea  ?? Family history of breast cancer 07/31/2021  ?? Family history of pancreatic cancer 07/31/2021  ?? Hemorrhoid   ??  Vaginal Pap smear, abnormal   ? colposcopy  ? ? ?SURGICAL HISTORY: ?Past Surgical History:  ?Procedure Laterality Date  ?? BREAST LUMPECTOMY Left 08/28/2021  ? Procedure: LEFT BREAST LUMPECTOMY;  Surgeon: Jovita Kussmaul, MD;  Location: [facility];  Service: General;  Laterality: Left;  ?? NO PAST SURGERIES    ? ? ?SOCIAL HISTORY: ?Social History  ? ?Socioeconomic History  ?? Marital status: Single  ?  Spouse name: Not on file  ?? Number of children: Not on file  ?? Years of education: Not on file  ?? Highest education level: Not on file  ?Occupational History  ?? Not on file  ?Tobacco Use  ?? Smoking status: Never  ?? Smokeless tobacco: Never  ?Vaping Use  ?? Vaping Use: Never used  ?Substance and Sexual Activity  ?? Alcohol use: Yes  ?  Comment: socially  ?? Drug use: No  ?? Sexual activity: Yes  ?  Birth control/protection: None  ?Other Topics Concern  ?? Not on file  ?Social History Narrative  ?? Not on file  ? ?Social Determinants of Health  ? ?Financial Resource Strain: Not on file  ?Food Insecurity: Not on file  ?Transportation Needs: Not on file  ?Physical Activity: Not on file  ?Stress: Not on file  ?Social Connections: Not on file  ?Intimate Partner Violence: Not on file  ? ? ?FAMILY HISTORY: ?Family History  ?Problem Relation Age of Onset  ?? Arthritis Mother   ?? Hypertension Mother   ?? Diabetes Mother   ?? Lung  cancer Father   ?     d. late 77s  ?? Depression Sister   ?? Asthma Brother   ?? Asthma Son   ?? Breast cancer Maternal Aunt   ?     dx late 101s  ?? Breast cancer Maternal Aunt   ?     dx mid 54s  ?? Breast cancer Paternal Aunt   ?     dx 9s  ?? Breast cancer Maternal Grandmother   ?     dx 66s  ?? Pancreatic cancer Maternal Grandmother   ?     dx 45s  ?? Cancer Paternal Grandmother   ?     unknown type; dx 50s-60s  ?? Cervical cancer Cousin   ?     maternal cousin  ? ? ?ALLERGIES:  has No Known Allergies. ? ?MEDICATIONS:  ?Current Outpatient Medications  ?Medication Sig Dispense  Refill  ?? fluticasone (FLONASE) 50 MCG/ACT nasal spray Place 2 sprays into both nostrils daily. 16 g 6  ?? levonorgestrel (LILETTA, 52 MG,) 20.1 MCG/DAY IUD 1 each by Intrauterine route once.    ?? meloxicam (MOBIC) 15 MG tablet Take 1 tablet (15 mg total) by mouth daily. 30 tablet 0  ? ?No current facility-administered medications for this visit.  ? ? ?REVIEW OF SYSTEMS:   ?Constitutional: Denies fevers, chills or abnormal night sweats ?Eyes: Denies blurriness of vision, double vision or watery eyes ?Ears, nose, mouth, throat, and face: Denies mucositis or sore throat ?Respiratory: Denies cough, dyspnea or wheezes ?Cardiovascular: Denies palpitation, chest discomfort or lower extremity swelling ?Gastrointestinal:  Denies nausea, heartburn or change in bowel habits ?Skin: Denies abnormal skin rashes ?Lymphatics: Denies new lymphadenopathy or easy bruising ?Neurological:Denies numbness, tingling or new weaknesses ?Behavioral/Psych: Mood is stable, no new changes  ?Breast: Palpable left breast lump which is being investigated right now ?All other systems were reviewed with the patient and are negative. ? ?PHYSICAL EXAMINATION: ?ECOG PERFORMANCE STATUS: 0 - Asymptomatic ? ?There were no vitals filed for this visit. ? ?There were no vitals filed for this visit. ? ? ?Physical exam deferred in lieu of counseling.  Patient had recent breast exam with surgery team. ? ?LABORATORY DATA:  ?I have reviewed the data as listed ?Lab Results  ?Component Value Date  ? WBC 4.7 04/29/2021  ? HGB 14.0 04/29/2021  ? HCT 42.1 04/29/2021  ? MCV 87 04/29/2021  ? PLT 270 04/29/2021  ? ?Lab Results  ?Component Value Date  ? NA 138 04/29/2021  ? K 4.5 04/29/2021  ? CL 103 04/29/2021  ? CO2 21 04/29/2021  ? ? ?RADIOGRAPHIC STUDIES: ?I have personally reviewed the radiological reports and agreed with the findings in the report. ? ?ASSESSMENT AND PLAN:  ? ?This is a very pleasant 36 year old premenopausal woman who was referred to high risk  breast clinic given increased lifetime risk of breast cancer and given abnormal mammogram. ? ?We discussed her life time risk of breast cancer and 5 yr risk of breast cancer today which is 37% per [employer] model and 0.4% per [employer] model respectively ? ?2. We discussed different risk reducing strategies for future breast cancer risk today.  At this time she does not qualify for tamoxifen prevention given low 5-year risk of breast cancer. ? ?3. Lifestyle modification: We discussed different interventions exercise at least 5 days a week including both aerobic as well as weight-bearing exercises and strength training. He discussed dietary modification including increasing number of servings of fruit and vegetables as  well as decreased in number of servings of meat. We discussed the importance of maintaining a good BMI and limiting alcohol intake. ? ?4. Surveillance: Patient certainly would be a good candidate for ongoing mammograms on a yearly basis as well as screening MRIs.  We have discussed unknown long-term risk of gadolinium deposition.  We have also discussed that MRI can lead to additional biopsies because of increased sensitivity.  She is agreeable to MRI, MRI ordered for April 2023 ? ?5. Chemoprevention: Does not qualify at this time ? ?6. Genetics: Genetic testing pending ? ?7. Followup: Patient will see me back in mid March to review pathology from lumpectomy. ? ?If no evidence of DCIS or invasive breast malignancy on final pathology, we will monitor her every 6 months with a history and physical examination, screening MRIs versus mammograms. ? ?All questions were answered. The patient knows to call the clinic with any problems, questions or concerns. ?I spent 45 minutes in the care of this patient including review of records, history and discussion of above-mentioned recommendations. ?  ? Benay Pike, MD ?09/18/21 ? ?This encounter was created in error - please disregard. ?

## 2021-09-17 NOTE — Telephone Encounter (Signed)
.  Called pt per 3/16 inbasket , Patient was unavailable, a message with appt time and date was left with number on file.   ?

## 2021-10-01 ENCOUNTER — Inpatient Hospital Stay: Payer: Medicaid Other | Attending: Hematology and Oncology | Admitting: Hematology and Oncology

## 2021-10-01 ENCOUNTER — Encounter: Payer: Self-pay | Admitting: Hematology and Oncology

## 2021-10-01 ENCOUNTER — Telehealth: Payer: Self-pay

## 2021-10-01 VITALS — BP 117/80 | HR 85 | Temp 97.5°F | Resp 16 | Ht 64.0 in | Wt 156.9 lb

## 2021-10-01 DIAGNOSIS — Z803 Family history of malignant neoplasm of breast: Secondary | ICD-10-CM | POA: Insufficient documentation

## 2021-10-01 DIAGNOSIS — Z9189 Other specified personal risk factors, not elsewhere classified: Secondary | ICD-10-CM | POA: Diagnosis not present

## 2021-10-01 DIAGNOSIS — Z1501 Genetic susceptibility to malignant neoplasm of breast: Secondary | ICD-10-CM | POA: Diagnosis not present

## 2021-10-01 NOTE — Progress Notes (Signed)
[place] ?CONSULT NOTE ? ?Patient Care Team: ?Marrian Salvage, FNP as PCP - General (Internal Medicine) ? ?CHIEF COMPLAINTS/PURPOSE OF CONSULTATION:  ?At high risk for breast cancer ? ?HISTORY OF PRESENTING ILLNESS:  ? ?Cynthia Wong 36 y.o. female is here because of recent diagnosis of high risk of  breast cancer. She was seen by our genetics team Lorelle Formosa and was found to have high risk of breast cancer based on Tyer Cusick model, her lifetime risk was about 37%.  She was hence recommended to follow-up in the high-risk breast clinic. ? ?She had left breast lumpectomy which showed complex sclerosing lesion with adenosis, usual ductal hyperplasia and apocrine metaplasia.  No evidence of malignancy. ? ?Since her last visit, she has been doing quite well.  No major healing issues since the lumpectomy.  She is willing to proceed with the mammograms and MRIs as recommended.  She was hoping to do some breast augmentation but has not done the genetic testing yet.  Rest of the pertinent 10 point ROS reviewed and negative. ? ?MEDICAL HISTORY:  ?Past Medical History:  ?Diagnosis Date  ? Bronchitis   ? Family history of adverse reaction to anesthesia   ? mother with nausea  ? Family history of breast cancer 07/31/2021  ? Family history of pancreatic cancer 07/31/2021  ? Hemorrhoid   ? Vaginal Pap smear, abnormal   ? colposcopy  ? ? ?SURGICAL HISTORY: ?Past Surgical History:  ?Procedure Laterality Date  ? BREAST LUMPECTOMY Left 08/28/2021  ? Procedure: LEFT BREAST LUMPECTOMY;  Surgeon: Jovita Kussmaul, MD;  Location: [facility];  Service: General;  Laterality: Left;  ? NO PAST SURGERIES    ? ? ?SOCIAL HISTORY: ?Social History  ? ?Socioeconomic History  ? Marital status: Single  ?  Spouse name: Not on file  ? Number of children: Not on file  ? Years of education: Not on file  ? Highest education level: Not on file  ?Occupational History  ? Not on file  ?Tobacco Use  ? Smoking  status: Never  ? Smokeless tobacco: Never  ?Vaping Use  ? Vaping Use: Never used  ?Substance and Sexual Activity  ? Alcohol use: Yes  ?  Comment: socially  ? Drug use: No  ? Sexual activity: Yes  ?  Birth control/protection: None  ?Other Topics Concern  ? Not on file  ?Social History Narrative  ? Not on file  ? ?Social Determinants of Health  ? ?Financial Resource Strain: Not on file  ?Food Insecurity: Not on file  ?Transportation Needs: Not on file  ?Physical Activity: Not on file  ?Stress: Not on file  ?Social Connections: Not on file  ?Intimate Partner Violence: Not on file  ? ? ?FAMILY HISTORY: ?Family History  ?Problem Relation Age of Onset  ? Arthritis Mother   ? Hypertension Mother   ? Diabetes Mother   ? Lung cancer Father   ?     d. late 46s  ? Depression Sister   ? Asthma Brother   ? Asthma Son   ? Breast cancer Maternal Aunt   ?     dx late 34s  ? Breast cancer Maternal Aunt   ?     dx mid 54s  ? Breast cancer Paternal Aunt   ?     dx 35s  ? Breast cancer Maternal Grandmother   ?     dx 103s  ? Pancreatic cancer Maternal Grandmother   ?  dx 41s  ? Cancer Paternal Grandmother   ?     unknown type; dx 50s-60s  ? Cervical cancer Cousin   ?     maternal cousin  ? ? ?ALLERGIES:  has No Known Allergies. ? ?MEDICATIONS:  ?Current Outpatient Medications  ?Medication Sig Dispense Refill  ? fluticasone (FLONASE) 50 MCG/ACT nasal spray Place 2 sprays into both nostrils daily. 16 g 6  ? levonorgestrel (LILETTA, 52 MG,) 20.1 MCG/DAY IUD 1 each by Intrauterine route once.    ? meloxicam (MOBIC) 15 MG tablet Take 1 tablet (15 mg total) by mouth daily. 30 tablet 0  ? ?No current facility-administered medications for this visit.  ? ? ?PHYSICAL EXAMINATION: ?ECOG PERFORMANCE STATUS: 0 - Asymptomatic ? ?Vitals:  ? 10/01/21 1313  ?BP: 117/80  ?Pulse: 85  ?Resp: 16  ?Temp: (!) 97.5 ?F (36.4 ?C)  ?SpO2: 100%  ? ? ?Filed Weights  ? 10/01/21 1313  ?Weight: 156 lb 14.4 oz (71.2 kg)  ? ?Physical Exam ?Constitutional:   ?    Appearance: Normal appearance.  ?Cardiovascular:  ?   Rate and Rhythm: Normal rate and regular rhythm.  ?   Pulses: Normal pulses.  ?   Heart sounds: Normal heart sounds.  ?Pulmonary:  ?   Effort: Pulmonary effort is normal. No respiratory distress.  ?   Breath sounds: Normal breath sounds.  ?Chest:  ?   Comments: Bilateral breasts inspected.  No concern for palpable masses.  No regional adenopathy ?Abdominal:  ?   General: Abdomen is flat. Bowel sounds are normal. There is no distension.  ?   Palpations: There is no mass.  ?Musculoskeletal:     ?   General: No swelling or tenderness. Normal range of motion.  ?   Cervical back: Normal range of motion and neck supple. No rigidity.  ?Lymphadenopathy:  ?   Cervical: No cervical adenopathy.  ?Skin: ?   General: Skin is warm and dry.  ?Neurological:  ?   General: No focal deficit present.  ?   Mental Status: She is alert.  ?Psychiatric:     ?   Mood and Affect: Mood normal.  ? ? ? ?LABORATORY DATA:  ?I have reviewed the data as listed ?Lab Results  ?Component Value Date  ? WBC 4.7 04/29/2021  ? HGB 14.0 04/29/2021  ? HCT 42.1 04/29/2021  ? MCV 87 04/29/2021  ? PLT 270 04/29/2021  ? ?Lab Results  ?Component Value Date  ? NA 138 04/29/2021  ? K 4.5 04/29/2021  ? CL 103 04/29/2021  ? CO2 21 04/29/2021  ? ? ?RADIOGRAPHIC STUDIES: ?I have personally reviewed the radiological reports and agreed with the findings in the report. ? ?ASSESSMENT AND PLAN:  ? ?This is a very pleasant 36 year old premenopausal woman who was referred to high risk breast clinic given increased lifetime risk of breast cancer and given abnormal mammogram. ?During her initial visit we have discussed her lifetime risk of breast cancer which comes to almost 37% per TC model however she does not qualify for tamoxifen prevention given 5-year risk of breast cancer being only 0.4%.  Her most recent breast lumpectomy did not show any evidence of malignancy.  We have once again discussed about lifestyle  modification, alternating MRIs with mammograms for breast cancer screening.  She understands that there is no clear evidence of long-term risk of gadolinium at this time however this could change.  She also understands that MRIs can be more sensitive and sometimes can lead to  more biopsies. ?With regards to consideration for breast augmentation, she I recommended that she proceed with genetic testing first and if she does not have any evidence of pathogenic mutations then she could consider it. ?I have given her the phone number to call to schedule the MRI breast. ?All questions were answered. The patient knows to call the clinic with any problems, questions or concerns. ?I spent 30 minutes in the care of this patient including review of records, history and discussion of above-mentioned recommendations. ?  ? Benay Pike, MD ?10/01/21 ? ?This encounter was created in error - please disregard. ?

## 2021-10-01 NOTE — Telephone Encounter (Signed)
LMVM inquiring if she is ready to move forward with bilateral breast augmentation with silicone implants. We can set up a virtual visit with Dr. Ulice Bold [place].  Recently had the removal of lesion of left breast with Dr. Carolynne Edouard on 09/11/2021 ?

## 2021-10-02 ENCOUNTER — Encounter: Payer: Self-pay | Admitting: Hematology and Oncology

## 2021-10-27 DIAGNOSIS — S139XXA Sprain of joints and ligaments of unspecified parts of neck, initial encounter: Secondary | ICD-10-CM | POA: Diagnosis not present

## 2021-10-27 DIAGNOSIS — S239XXA Sprain of unspecified parts of thorax, initial encounter: Secondary | ICD-10-CM | POA: Diagnosis not present

## 2021-10-27 DIAGNOSIS — S335XXA Sprain of ligaments of lumbar spine, initial encounter: Secondary | ICD-10-CM | POA: Diagnosis not present

## 2021-10-28 ENCOUNTER — Ambulatory Visit: Payer: Medicaid Other | Admitting: Internal Medicine

## 2021-10-28 ENCOUNTER — Encounter: Payer: Self-pay | Admitting: Internal Medicine

## 2021-10-28 DIAGNOSIS — R058 Other specified cough: Secondary | ICD-10-CM

## 2021-10-28 LAB — CBC WITH DIFFERENTIAL/PLATELET
Basophils Absolute: 0.1 10*3/uL (ref 0.0–0.1)
Basophils Relative: 1.2 % (ref 0.0–3.0)
Eosinophils Absolute: 0.2 10*3/uL (ref 0.0–0.7)
Eosinophils Relative: 3.1 % (ref 0.0–5.0)
HCT: 40.7 % (ref 36.0–46.0)
Hemoglobin: 13 g/dL (ref 12.0–15.0)
Lymphocytes Relative: 38.6 % (ref 12.0–46.0)
Lymphs Abs: 2.1 10*3/uL (ref 0.7–4.0)
MCHC: 32 g/dL (ref 30.0–36.0)
MCV: 88.8 fl (ref 78.0–100.0)
Monocytes Absolute: 0.3 10*3/uL (ref 0.1–1.0)
Monocytes Relative: 6.2 % (ref 3.0–12.0)
Neutro Abs: 2.8 10*3/uL (ref 1.4–7.7)
Neutrophils Relative %: 50.9 % (ref 43.0–77.0)
Platelets: 271 10*3/uL (ref 150.0–400.0)
RBC: 4.58 Mil/uL (ref 3.87–5.11)
RDW: 13.5 % (ref 11.5–15.5)
WBC: 5.6 10*3/uL (ref 4.0–10.5)

## 2021-10-28 MED ORDER — FAMOTIDINE 20 MG PO TABS: ORAL_TABLET | ORAL | 11 refills | Status: DC

## 2021-10-28 MED ORDER — PANTOPRAZOLE SODIUM 40 MG PO TBEC: 40.0000 mg | DELAYED_RELEASE_TABLET | Freq: Every day | ORAL | 2 refills | Status: DC

## 2021-10-28 NOTE — Assessment & Plan Note (Signed)
Onset 2020 around the time of IUP and worse since on Progesterone containing IUD ?-  Allergy screen 10/28/2021 >  Eos 0. /  IgE   ?- 10/28/2021 max gerd rx plus delsym ? ?Absence during sleep and worse early on insp typical of Upper airway cough syndrome (previously labeled PNDS),  is so named because it's frequently impossible to sort out how much is  CR/sinusitis with freq throat clearing (which can be related to primary GERD)   vs  causing  secondary (" extra esophageal")  GERD from wide swings in gastric pressure that occur with throat clearing, often  promoting self use of mint and menthol lozenges that reduce the lower esophageal sphincter tone and exacerbate the problem further in a cyclical fashion.  ? ?These are the same pts (now being labeled as having "irritable larynx syndrome" by some cough centers) who not infrequently have a history of having failed to tolerate ace inhibitors,  dry powder inhalers or biphosphonates or report having atypical/extraesophageal reflux symptoms that don't respond to standard doses of PPI  and are easily confused as having aecopd or asthma flares by even experienced allergists/ pulmonologists (myself included).  ? ?Of the three most common causes of  Sub-acute / recurrent or chronic cough, only one (GERD)  can actually contribute to/ trigger  the other two (asthma and post nasal drip syndrome)  and perpetuate the cylce of cough. ? ?While not intuitively obvious, many patients with chronic low grade reflux do not cough until there is a primary insult that disturbs the protective epithelial barrier and exposes sensitive nerve endings.   This is typically viral but can due to PNDS and  either may apply here.  ? ?>>>  The point is that once this occurs, it is difficult to eliminate the cycle  using anything but a maximally effective acid suppression regimen at least in the short run, accompanied by an appropriate diet to address non acid GERD and control / eliminate the cough for  a week with delsym then try off it again. ? ?Reviewed with pt in detail:  The standardized cough guidelines published in Chest by [place] in 2006 are still the best available and consist of a multiple step process (up to 12!) , not a single office visit,  and are intended  to address this problem logically,  with an alogrithm dependent on response to empiric treatment at  each progressive step  to determine a specific diagnosis with  minimal addtional testing needed. Therefore if adherence is an issue or can't be accurately verified,  it's very unlikely the standard evaluation and treatment will be successful here.   ? ?Furthermore, response to therapy (other than acute cough suppression, which should only be used short term with avoidance of narcotic containing cough syrups if possible), can be a gradual process for which the patient is not likely to  perceive immediate benefit. ? ?Unlike going to an eye doctor where the best perscription is almost always the first one and is immediately effective, this is almost never the case in the management of chronic cough syndromes. Therefore the patient needs to commit up front to consistently adhere to recommendations  for up to 6 weeks of therapy directed at the likely underlying problem(s) before the response can be reasonably evaluated.  ? ?>>> f/u in 6 weeks, sooner if needed  ? ? ?    ?  ? ?Each maintenance medication was reviewed in detail including emphasizing most importantly the difference between maintenance  and prns and under what circumstances the prns are to be triggered using an action plan format where appropriate. ? ?Total time for H and P, chart review, counseling,  and generating customized AVS unique to this office visit / same day charting =  45 min  for new patient eval  ?

## 2021-10-28 NOTE — Patient Instructions (Signed)
Please remember to go to the lab department   for your tests - we will call you with the results when they are available. ? ?Pantoprazole (protonix) 40 mg   Take  30-60 min before first meal of the day and Pepcid (famotidine)  20 mg after supper until return to office - this is the best way to tell whether stomach acid is contributing to your problem.   ?    ? ?GERD (REFLUX)  is an extremely common cause of respiratory symptoms just like yours , many times with no obvious heartburn at all.  ? ? It can be treated with medication, but also with lifestyle changes including elevation of the head of your bed (ideally with 6 -8inch blocks under the headboard of your bed),  Smoking cessation, avoidance of late meals, excessive alcohol, and avoid fatty foods, chocolate, peppermint, colas, red wine, and acidic juices such as orange juice.  ?NO MINT OR MENTHOL PRODUCTS SO NO COUGH DROPS  ?USE SUGARLESS CANDY INSTEAD (Jolley ranchers or Stover's or Life Savers) or even ice chips will also do - the key is to swallow to prevent all throat clearing. ?NO OIL BASED VITAMINS - use powdered substitutes.  Avoid fish oil when coughing.  ? ? ?For cough  >  delsym 2 tsp every 12 hours as much as you stop even the throat clearing ? ? ?Please schedule a follow up office visit in 6 weeks, call sooner if needed  ?

## 2021-10-28 NOTE — Progress Notes (Signed)
? ?Cynthia SettleKeshadda S Sickles, female    DOB: 08/23/1985,   MRN: [id number] ? ? 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 ? ?Was pregnant  2011 no resp problme ? ?Pregnant in old house 2020 and started before IUP and worse after and assoc with sob that resolved  ? ? ?History of Present Illness  ?10/28/2021  Pulmonary/ 1st office eval/Domanique Luckett  ?Chief Complaint  ?Patient presents with  ? Consult  ?  Patient has had a dry cough for about 2 years and some shortness of breath with exertion and going up stairs.   ?Dyspnea:  only with steps or bad cough  ?Cough: after stirs in am / not noct/  cough is dry / has noted nose running and rx helped but not the cough  ?Sleep: on back/ one pillow/ flat better ?SABA use: ? Helps  ?No prednisone trial, delsym seemed to help. ? ?No obvious day to day or daytime variability or assoc excess/ purulent sputum or mucus plugs or hemoptysis or cp or chest tightness, subjective wheeze or overt sinus or hb symptoms.  ? ?Sleeping as above without nocturnal  or early am exacerbation  of respiratory  c/o's or need for noct saba. Also denies any obvious fluctuation of symptoms with weather or environmental changes or other aggravating or alleviating factors except as outlined above  ? ?No unusual exposure hx or h/o childhood pna/ asthma or knowledge of premature birth. ? ?Current Allergies, Complete Past Medical History, Past Surgical History, Family History, and Social History were reviewed in [facility] electronic medical record. ? ?ROS  The following are not active complaints unless bolded ?Hoarseness, sore throat, dysphagia, dental problems, itching, sneezing,  nasal congestion or discharge of excess mucus or purulent secretions, ear ache,   fever, chills, sweats, unintended wt  loss or wt gain, classically pleuritic or exertional cp,  orthopnea pnd or arm/hand swelling  or leg swelling, presyncope, palpitations, abdominal pain, anorexia, nausea, vomiting, diarrhea  or change in bowel habits or change in bladder habits, change in stools or change in urine, dysuria, hematuria,  rash, arthralgias, visual complaints, headache, numbness, weakness or ataxia or problems with walking or coordination,  change in mood or  memory. ?      ?   ? ? ?Past Medical History:  ?Diagnosis Date  ? Bronchitis   ? Family history of adverse reaction to anesthesia   ? mother with nausea  ? Family history of breast cancer 07/31/2021  ? Family history of pancreatic cancer 07/31/2021  ? Hemorrhoid   ? Vaginal Pap smear, abnormal   ? colposcopy  ? ? ?Outpatient Medications Prior to Visit  ?Medication Sig Dispense Refill  ? fluticasone (FLONASE) 50 MCG/ACT nasal spray Place 2 sprays into both nostrils daily. 16 g 6  ? levonorgestrel (LILETTA, 52 MG,) 20.1 MCG/DAY IUD 1 each by Intrauterine route once.    ? meloxicam (MOBIC) 15 MG tablet Take 1 tablet (15 mg total) by mouth daily. (Patient taking differently: Take 15 mg by mouth daily. Patient taking as needed) 30 tablet 0  ? ?No facility-administered medications prior to visit.  ? ? ? ?Objective:  ?  ? ?BP 118/80 (BP Location: Left Arm, Patient Position: Sitting, Cuff Size: Normal)   Pulse 90  Temp 98.7 ?F (37.1 ?C) (Oral)   Ht 5\' 4"  (1.626 m)   Wt 158 lb 9.6 oz (71.9 kg)   SpO2 98%   BMI 27.22 kg/m?  ? ?SpO2: 98 % RA ? ? ? HEENT : min cobblestoning/ erythema/ watery pnd/ nasal turbinates nl  ? ? ?NECK :  without JVD/Nodes/TM/ nl carotid upstrokes bilaterally ? ? ?LUNGS: no acc muscle use,  Nl contour chest which is clear to A and P bilaterally without cough on insp or exp maneuvers ? ? ?CV:  RRR  no s3 or murmur or increase in P2, and no edema  ? ?ABD:  soft and nontender with nl inspiratory excursion in the supine position. No bruits or organomegaly  appreciated, bowel sounds nl ? ?MS:  Nl gait/ ext warm without deformities, calf tenderness, cyanosis or clubbing ?No obvious joint restrictions  ? ?SKIN: warm and dry without lesions   ? ?NEURO:  alert, approp, nl sensorium with  no motor or cerebellar deficits apparent.  ? ? ? ? ?   ?I personally reviewed images and agree with radiology impression as follows:  ?CXR:   pa and lateral ?No active cardiopulmonary disease. ?Assessment  ? ?Upper airway cough syndrome ?Onset 2020 around the time of IUP and worse since on Progesterone containing IUD ?-  Allergy screen 10/28/2021 >  Eos 0. /  IgE   ?- 10/28/2021 max gerd rx plus delsym ? ?Absence during sleep and worse early on insp typical of Upper airway cough syndrome (previously labeled PNDS),  is so named because it's frequently impossible to sort out how much is  CR/sinusitis with freq throat clearing (which can be related to primary GERD)   vs  causing  secondary (" extra esophageal")  GERD from wide swings in gastric pressure that occur with throat clearing, often  promoting self use of mint and menthol lozenges that reduce the lower esophageal sphincter tone and exacerbate the problem further in a cyclical fashion.  ? ?These are the same pts (now being labeled as having "irritable larynx syndrome" by some cough centers) who not infrequently have a history of having failed to tolerate ace inhibitors,  dry powder inhalers or biphosphonates or report having atypical/extraesophageal reflux symptoms that don't respond to standard doses of PPI  and are easily confused as having aecopd or asthma flares by even experienced allergists/ pulmonologists (myself included).  ? ?Of the three most common causes of  Sub-acute / recurrent or chronic cough, only one (GERD)  can actually contribute to/ trigger  the other two (asthma and post nasal drip syndrome)  and perpetuate the cylce of cough. ? ?While not intuitively obvious, many patients with chronic low grade reflux do not  cough until there is a primary insult that disturbs the protective epithelial barrier and exposes sensitive nerve endings.   This is typically viral but can due to PNDS and  either may apply here.  ? ?>>>  The point is that once this occurs, it is difficult to eliminate the cycle  using anything but a maximally effective acid suppression regimen at least in the short run, accompanied by an appropriate diet to address non acid GERD and control / eliminate the cough for a week with delsym then try off it again. ? ?Reviewed with pt in detail:  The standardized cough guidelines published in Chest by 10/30/2021 in 2006 are still the best available and consist of a multiple step process (up to 12!) , not a single office visit,  and are intended  to address this problem logically,  with an alogrithm dependent on response to empiric treatment at  each progressive step  to determine a specific diagnosis with  minimal addtional testing needed. Therefore if adherence is an issue or can't be accurately verified,  it's very unlikely the standard evaluation and treatment will be successful here.   ? ?Furthermore, response to therapy (other than acute cough suppression, which should only be used short term with avoidance of narcotic containing cough syrups if possible), can be a gradual process for which the patient is not likely to  perceive immediate benefit. ? ?Unlike going to an eye doctor where the best perscription is almost always the first one and is immediately effective, this is almost never the case in the management of chronic cough syndromes. Therefore the patient needs to commit up front to consistently adhere to recommendations  for up to 6 weeks of therapy directed at the likely underlying problem(s) before the response can be reasonably evaluated.  ? ?>>> f/u in 6 weeks, sooner if needed  ? ? ?    ?  ? ?Each maintenance medication was reviewed in detail including emphasizing most importantly the difference  between maintenance and prns and under what circumstances the prns are to be triggered using an action plan format where appropriate. ? ?Total time for H and P, chart review, counseling,  and generating customized AVS unique

## 2021-10-29 LAB — IGE: IgE (Immunoglobulin E), Serum: 41 kU/L (ref <=114)

## 2021-11-22 ENCOUNTER — Ambulatory Visit
Admission: RE | Admit: 2021-11-22 | Discharge: 2021-11-22 | Disposition: A | Discharge status: Home / Self Care | Payer: Medicaid Other | Source: Ambulatory Visit | Attending: Hematology and Oncology | Admitting: Hematology and Oncology

## 2021-11-22 DIAGNOSIS — Z803 Family history of malignant neoplasm of breast: Secondary | ICD-10-CM

## 2021-11-22 DIAGNOSIS — Z9189 Other specified personal risk factors, not elsewhere classified: Secondary | ICD-10-CM

## 2021-11-22 MED ORDER — GADOBUTROL 1 MMOL/ML IV SOLN
7.0000 mL | Freq: Once | INTRAVENOUS | Status: AC | PRN
  Administered 2021-11-22: 7 mL via INTRAVENOUS

## 2021-11-24 ENCOUNTER — Other Ambulatory Visit: Payer: Self-pay | Admitting: Hematology and Oncology

## 2021-11-24 DIAGNOSIS — R9389 Abnormal findings on diagnostic imaging of other specified body structures: Secondary | ICD-10-CM

## 2021-11-25 ENCOUNTER — Telehealth: Payer: Self-pay | Admitting: Hematology and Oncology

## 2021-11-25 ENCOUNTER — Other Ambulatory Visit: Payer: Self-pay | Admitting: Internal Medicine

## 2021-11-25 DIAGNOSIS — R058 Other specified cough: Secondary | ICD-10-CM

## 2021-11-25 NOTE — Telephone Encounter (Signed)
Scheduled appointment per 05/23 los. Patient aware.  

## 2021-12-04 ENCOUNTER — Ambulatory Visit
Admission: RE | Admit: 2021-12-04 | Discharge: 2021-12-04 | Disposition: A | Discharge status: Home / Self Care | Payer: Medicaid Other | Source: Ambulatory Visit | Attending: Hematology and Oncology | Admitting: Hematology and Oncology

## 2021-12-04 DIAGNOSIS — N6311 Unspecified lump in the right breast, upper outer quadrant: Secondary | ICD-10-CM | POA: Diagnosis not present

## 2021-12-04 DIAGNOSIS — R9389 Abnormal findings on diagnostic imaging of other specified body structures: Secondary | ICD-10-CM

## 2021-12-04 MED ORDER — GADOBUTROL 1 MMOL/ML IV SOLN
7.0000 mL | Freq: Once | INTRAVENOUS | Status: AC | PRN
  Administered 2021-12-04: 7 mL via INTRAVENOUS

## 2021-12-09 ENCOUNTER — Ambulatory Visit: Payer: Medicaid Other | Admitting: Internal Medicine

## 2021-12-09 ENCOUNTER — Encounter: Payer: Self-pay | Admitting: Internal Medicine

## 2021-12-09 DIAGNOSIS — R058 Other specified cough: Secondary | ICD-10-CM

## 2021-12-09 MED ORDER — GABAPENTIN 100 MG PO CAPS: 100.0000 mg | ORAL_CAPSULE | Freq: Three times a day (TID) | ORAL | 2 refills | Status: DC

## 2021-12-09 NOTE — Progress Notes (Signed)
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  Was pregnant  2011   no resp problem and cough before IUP in 2020 and worse by delivery march 2021 and present daily since.      History of Present Illness  10/28/2021  Pulmonary/ 1st office eval/Maecy Podgurski  Chief Complaint  Patient presents with   Consult    Patient has had a dry cough for about 2 years and some shortness of breath with exertion and going up stairs.   Dyspnea:  only with steps or bad cough  Cough: after stirs in am / no  noct/  cough is dry / has noted nose running and rx helped but not the cough  Sleep: on back/ one pillow/ flat better SABA use: ? Helps  No prednisone trial, delsym seemed to help Rec Please remember to go to the lab department   for your tests - we will call you with the results when they are available. Pantoprazole (protonix) 40 mg   Take  30-60 min before first meal of the day and Pepcid (famotidine)  20 mg after supper until return to office  GERD diet reviewed, bed blocks rec  For cough  >  delsym 2 tsp every 12 hours as much as you stop even the throat clearing   12/09/2021  f/u ov/Estevan Kersh re: cough maint on gerd rx  no better dry daytime cough/ throat clearing  Chief Complaint  Patient presents with   Follow-up    Pt states that her cough has not gotten any better since last visit even after doing recommendations from last visit.  Dyspnea:  Not limited by breathing from desired activities   Cough: dry, once stirs, rare noct  Sleeping: ok  SABA use: not using/ flovent also not helpful tessalon 02: none  Covid status:   vax none/ infected twice    No obvious day to day or daytime variability or assoc  excess/ purulent sputum or mucus plugs or hemoptysis or cp or chest tightness, subjective wheeze or overt sinus or hb symptoms.   Sleeping  without nocturnal  or early am exacerbation  of respiratory  c/o's or need for noct saba. Also denies any obvious fluctuation of symptoms with weather or environmental changes or other aggravating or alleviating factors except as outlined above   No unusual exposure hx or h/o childhood pna/ asthma or knowledge of premature birth.  Current Allergies, Complete Past Medical History, Past Surgical History, Family History, and Social History were reviewed in [employer] record.  ROS  The following are not active complaints unless bolded Hoarseness, sore throat, dysphagia, dental problems, itching, sneezing,  nasal congestion or discharge of excess mucus or purulent secretions, ear ache,   fever, chills, sweats, unintended wt loss or wt gain, classically pleuritic or exertional cp,  orthopnea pnd or arm/hand swelling  or leg swelling, presyncope, palpitations, abdominal pain, anorexia, nausea, vomiting, diarrhea  or change in bowel habits or change in bladder habits, change in stools or change in urine, dysuria, hematuria,  rash, arthralgias, visual complaints, headache, numbness, weakness or ataxia or problems with walking or coordination,  change in mood or  memory.        Current Meds  Medication Sig   famotidine (PEPCID) 20 MG tablet One after supper   fluticasone (FLONASE) 50 MCG/ACT nasal spray Place 2 sprays into both nostrils daily.   levonorgestrel (LILETTA, 52 MG,) 20.1 MCG/DAY IUD 1 each by Intrauterine route once.   pantoprazole (PROTONIX) 40 MG tablet TAKE 1 TABLET (40 MG TOTAL) BY MOUTH DAILY. TAKE 30-60 MIN BEFORE FIRST MEAL OF THE DAY          Objective:      Wt Readings from Last 3 Encounters:  12/09/21 162 lb 6.4 oz (73.7 kg)  10/28/21 158 lb 9.6 oz (71.9 kg)  10/01/21 156 lb 14.4 oz (71.2 kg)      Vital signs  reviewed  12/09/2021  - Note at rest 02 sats  100% on RA   General appearance:    pleasant amb bf freq  throat  clearing    HEENT : Oropharynx  min watery pnd     Nasal turbinates nl    NECK :  without  appent JVD/ palpable Nodes/TM    LUNGS: no acc muscle use,  Nl contour chest which is clear to A and P bilaterally without cough on insp or exp maneuvers   CV:  RRR  no s3 or murmur or increase in P2, and no edema   ABD:  soft and nontender with nl inspiratory excursion in the supine position. No bruits or organomegaly appreciated   MS:  Nl gait/ ext warm without deformities Or obvious joint restrictions  calf tenderness, cyanosis or clubbing    SKIN: warm and dry without lesions    NEURO:  alert, approp, nl sensorium with  no motor or cerebellar deficits apparent.     I personally reviewed images and agree with radiology impression as follows:  CXR:   09/11/21  for cough pa and lat No active cardiopulmonary disease. Assessment

## 2021-12-09 NOTE — Assessment & Plan Note (Signed)
Onset 2020 around the time of IUP and worse since on Progesterone containing IUD -  Allergy screen 10/28/2021 >  Eos 0.2 /  IgE 41   - 10/28/2021 max gerd rx plus delsym > not better -  12/09/2021  Gabapentin 100 mg tid trial   Of the three most common causes of  Sub-acute / recurrent or chronic cough, only one (GERD)  can actually contribute to/ trigger  the other two (asthma and post nasal drip syndrome)  and perpetuate the cylce of cough.  While not intuitively obvious, many patients with chronic low grade reflux do not cough until there is a primary insult that disturbs the protective epithelial barrier and exposes sensitive nerve endings.   This is typically viral but can due to PNDS and  either may apply here.   The point is that once this occurs, it is difficult to eliminate the cycle  using anything but a maximally effective acid suppression regimen at least in the short run, accompanied by an appropriate diet to address non acid GERD and control / eliminate the cough itself with gabapentin titrated to max dose of 300 mg qid or lowest effective / tol dose and if not improving/tolerating refer to Lynelle Doctor at [facility] voice center  Discussed in detail all the  indications, usual  risks and alternatives  relative to the benefits with patient who agrees to proceed with Rx as outlined.      F/u in 6 weeks, call sooner prn         Each maintenance medication was reviewed in detail including emphasizing most importantly the difference between maintenance and prns and under what circumstances the prns are to be triggered using an action plan format where appropriate.  Total time for H and P, chart review, counseling, and generating customized AVS unique to this office visit / same day charting = > 30 min for  Chronic   refractory respiratory  symptoms of uncertain etiology

## 2021-12-09 NOTE — Patient Instructions (Signed)
Gabapnetin 100 mg twice daily for week for at least a week and if not better add another at lunchtime    GERD (REFLUX)  is an extremely common cause of respiratory symptoms just like yours , many times with no obvious heartburn at all.    It can be treated with medication, but also with lifestyle changes including elevation of the head of your bed (ideally with 6 -8inch blocks under the headboard of your bed),  Smoking cessation, avoidance of late meals, excessive alcohol, and avoid fatty foods, chocolate, peppermint, colas, red wine, and acidic juices such as orange juice.  NO MINT OR MENTHOL PRODUCTS SO NO COUGH DROPS  USE SUGARLESS CANDY INSTEAD (Jolley ranchers or Stover's or Life Savers) or even ice chips will also do - the key is to swallow to prevent all throat clearing. NO OIL BASED VITAMINS - use powdered substitutes.  Avoid fish oil when coughing.    Please schedule a follow up office visit in 6 weeks, call sooner if needed

## 2021-12-15 ENCOUNTER — Ambulatory Visit: Payer: Medicaid Other | Admitting: Family

## 2021-12-15 ENCOUNTER — Encounter: Payer: Self-pay | Admitting: Family

## 2021-12-15 VITALS — BP 110/73 | HR 86 | Temp 98.2°F | Resp 12 | Ht 64.0 in | Wt 165.8 lb

## 2021-12-15 DIAGNOSIS — L739 Follicular disorder, unspecified: Secondary | ICD-10-CM | POA: Diagnosis not present

## 2021-12-15 DIAGNOSIS — L219 Seborrheic dermatitis, unspecified: Secondary | ICD-10-CM | POA: Diagnosis not present

## 2021-12-15 MED ORDER — KETOCONAZOLE 2 % EX SHAM: 1.0000 "application " | MEDICATED_SHAMPOO | CUTANEOUS | 0 refills | Status: DC

## 2021-12-15 MED ORDER — DOXYCYCLINE HYCLATE 100 MG PO TABS: 100.0000 mg | ORAL_TABLET | Freq: Two times a day (BID) | ORAL | 0 refills | Status: DC

## 2021-12-15 MED ORDER — FLUCONAZOLE 150 MG PO TABS: ORAL_TABLET | ORAL | 0 refills | Status: DC

## 2021-12-15 NOTE — Progress Notes (Signed)
Cynthia Wong is a 36 y.o. female with the following history as recorded in [employer]:  Patient Active Problem List   Diagnosis Date Noted   Upper airway cough syndrome 10/28/2021   Family history of breast cancer 07/31/2021   Family history of pancreatic cancer 07/31/2021   Genetic testing 07/31/2021   Encounter for counseling 07/20/2021   Left breast mass 03/20/2021   Asthma 09/04/2019   Dyspnea 07/25/2019   Alpha thalassemia silent carrier 06/04/2019   History of stillbirth in currently pregnant patient, unspecified trimester 03/08/2019    Current Outpatient Medications  Medication Sig Dispense Refill   doxycycline (VIBRA-TABS) 100 MG tablet Take 1 tablet (100 mg total) by mouth 2 (two) times daily. 14 tablet 0   famotidine (PEPCID) 20 MG tablet One after supper 30 tablet 11   fluconazole (DIFLUCAN) 150 MG tablet Take 1 tablet daily as directed; repeat after 72 hours 2 tablet 0   gabapentin (NEURONTIN) 100 MG capsule Take 1 capsule (100 mg total) by mouth 3 (three) times daily. 90 capsule 2   [START ON 12/17/2021] ketoconazole (NIZORAL) 2 % shampoo Apply 1 application  topically 2 (two) times a week. 120 mL 0   levonorgestrel (LILETTA, 52 MG,) 20.1 MCG/DAY IUD 1 each by Intrauterine route once.     pantoprazole (PROTONIX) 40 MG tablet TAKE 1 TABLET (40 MG TOTAL) BY MOUTH DAILY. TAKE 30-60 MIN BEFORE FIRST MEAL OF THE DAY 30 tablet 0   No current facility-administered medications for this visit.    Allergies: Patient has no known allergies.  Past Medical History:  Diagnosis Date   Bronchitis    Family history of adverse reaction to anesthesia    mother with nausea   Family history of breast cancer 07/31/2021   Family history of pancreatic cancer 07/31/2021   Hemorrhoid    Vaginal Pap smear, abnormal    colposcopy    Past Surgical History:  Procedure Laterality Date   BREAST LUMPECTOMY Left 08/28/2021   Procedure: LEFT BREAST LUMPECTOMY;  Surgeon: Griselda Miner, MD;   Location:  SUR[facility]ERY [facility];  Service: General;  Laterality: Left;   NO PAST SURGERIES      Family History  Problem Relation Age of Onset   Arthritis Mother    Hypertension Mother    Diabetes Mother    Lung cancer Father        d. late 27s   Depression Sister    Asthma Brother    Asthma Son    Breast cancer Maternal Aunt        dx late 68s   Breast cancer Maternal Aunt        dx mid 66s   Breast cancer Paternal Aunt        dx 68s   Breast cancer Maternal Grandmother        dx 90s   Pancreatic cancer Maternal Grandmother        dx 26s   Cancer Paternal Grandmother        unknown type; dx 50s-60s   Cervical cancer Cousin        maternal cousin    Social History   Tobacco Use   Smoking status: Never   Smokeless tobacco: Never  Substance Use Topics   Alcohol use: Yes    Comment: socially    Subjective:   1) Dry scaly, flaking skin on scalp/ forehead/ behind both ears; no response to OTC dandruff medication; not able to see dermatology until November;  2) Removed  ingrown hair on right inner thigh- concerned for infection;     Objective:  Vitals:   12/15/21 1123  BP: 110/73  Pulse: 86  Resp: 12  Temp: 98.2 F (36.8 C)  TempSrc: Oral  SpO2: 100%  Weight: 165 lb 12.8 oz (75.2 kg)  Height: 5\' 4"  (1.626 m)    General: Well developed, well nourished, in no acute distress  Skin : Warm and dry.  Head: Normocephalic and atraumatic  Lungs: Respirations unlabored;  Neurologic: Alert and oriented; speech intact; face symmetrical; moves all extremities well; CNII-XII intact without focal deficit   Assessment:  1. Seborrheic dermatitis   2. Folliculitis     Plan:  Rx for Nizoral shampoo; she will call her dermatologist and ask about cancellation list to be seen sooner ir possible; Rx for Doxycycline 100 mg bid x 7 days; follow up worse, no better.   No follow-ups on file.  No orders of the defined types were placed in this encounter.   Requested  Prescriptions   Signed Prescriptions Disp Refills   ketoconazole (NIZORAL) 2 % shampoo 120 mL 0    Sig: Apply 1 application  topically 2 (two) times a week.   doxycycline (VIBRA-TABS) 100 MG tablet 14 tablet 0    Sig: Take 1 tablet (100 mg total) by mouth 2 (two) times daily.   fluconazole (DIFLUCAN) 150 MG tablet 2 tablet 0    Sig: Take 1 tablet daily as directed; repeat after 72 hours

## 2021-12-15 NOTE — Patient Instructions (Signed)
Seborrheic Dermatitis, Adult Seborrheic dermatitis is a skin disease that causes red, scaly patches. It usually occurs on the scalp, and it is often called dandruff. The patches may appear on other parts of the body. Skin patches tend to appear where there are many oil glands in the skin. Areas of the body that are commonly affected include the: Scalp. Ears. Eyebrows. Face. Bearded area of men's faces. Skin folds of the body, such as the armpits, groin, and buttocks. Chest. The condition may come and go for no known reason, and it is often long-lasting (chronic). What are the causes? The cause of this condition is not known. What increases the risk? The following factors may make you more likely to develop this condition: Having certain conditions, such as: HIV (human immunodeficiency virus). AIDS (acquired immunodeficiency syndrome). Parkinson's disease. Mood disorders, such as depression. Being 40-60 years old. What are the signs or symptoms? Symptoms of this condition include: Thick scales on the scalp. Redness on the face or in the armpits. Skin that is flaky. The flakes may be white or yellow. Skin that seems oily or dry but is not helped with moisturizers. Itching or burning in the affected areas. How is this diagnosed? This condition is diagnosed with a medical history and physical exam. A sample of your skin may be tested (skin biopsy). You may need to see a skin specialist (dermatologist). How is this treated? There is no cure for this condition, but treatment can help to manage the symptoms. You may get treatment to remove scales, lower the risk of skin infection, and reduce swelling or itching. Treatment may include: Creams that reduce skin yeast. Medicated shampoo. Moisturizing creams or ointments. Creams that reduce swelling and irritation (steroids). Follow these instructions at home: Apply over-the-counter and prescription medicines only as told by your health care  provider. Use any medicated shampoo, skin creams, or ointments only as told by your health care provider. Keep all follow-up visits as told by your health care provider. This is important. Contact a health care provider if: Your symptoms do not improve with treatment. Your symptoms get worse. You have new symptoms. Get help right away if: Your condition rapidly worsens with treatment. Summary Seborrheic dermatitis is a skin disease that causes red, scaly patches. Seborrheic dermatitis commonly affects the scalp, face, and skin folds. There is no cure for this condition, but treatment can help to manage the symptoms. This information is not intended to replace advice given to you by your health care provider. Make sure you discuss any questions you have with your health care provider. Document Revised: 03/29/2019 Document Reviewed: 03/29/2019 Elsevier Patient Education  2023 Elsevier Inc.  

## 2021-12-21 ENCOUNTER — Other Ambulatory Visit: Payer: Self-pay | Admitting: Hematology and Oncology

## 2021-12-21 DIAGNOSIS — Z9189 Other specified personal risk factors, not elsewhere classified: Secondary | ICD-10-CM

## 2021-12-21 NOTE — Progress Notes (Signed)
Repeat MRI ordered per radiology recommendations.

## 2021-12-25 ENCOUNTER — Encounter: Payer: Self-pay | Admitting: Hematology and Oncology

## 2021-12-25 ENCOUNTER — Other Ambulatory Visit: Payer: Self-pay

## 2021-12-25 ENCOUNTER — Inpatient Hospital Stay: Payer: Medicaid Other | Attending: Hematology and Oncology | Admitting: Hematology and Oncology

## 2021-12-25 VITALS — BP 126/79 | HR 84 | Temp 97.7°F | Resp 16 | Ht 64.0 in | Wt 163.7 lb

## 2021-12-25 DIAGNOSIS — Z803 Family history of malignant neoplasm of breast: Secondary | ICD-10-CM | POA: Insufficient documentation

## 2021-12-25 DIAGNOSIS — Z9189 Other specified personal risk factors, not elsewhere classified: Secondary | ICD-10-CM

## 2021-12-25 DIAGNOSIS — Z1501 Genetic susceptibility to malignant neoplasm of breast: Secondary | ICD-10-CM | POA: Insufficient documentation

## 2021-12-25 NOTE — Progress Notes (Signed)
[facility] CONSULT NOTE  Patient Care Team: Olive Bass, FNP as PCP - General (Internal Medicine)  CHIEF COMPLAINTS/PURPOSE OF CONSULTATION:  At high risk for breast cancer  HISTORY OF PRESENTING ILLNESS:   Cynthia Wong 36 y.o. female is here because of recent diagnosis of high risk of  breast cancer. She was seen by our genetics team Jeffie Pollock and was found to have high risk of breast cancer based on Tyer Cusick model, her lifetime risk was about 37%.  She was hence recommended to follow-up in the high-risk breast clinic.  She had left breast lumpectomy which showed complex sclerosing lesion with adenosis, usual ductal hyperplasia and apocrine metaplasia.  No evidence of malignancy.  Last breast imaging was on Nov 22, 2021 which showed an indeterminate mass in the upper outer posterior right breast.  This was removed and showed benign breast tissue, negative for malignancy.  She is due for mammogram in November 2023  She denies any breast changes since her last visit.  She reports additional family history of breast cancer in paternal aunt diagnosed in her 12s.  She is yet to move forward with genetic testing.  She is done with childbearing, has a 62 and a 78-year-old.  No other new health complaints.  Rest of the pertinent 10 point ROS reviewed and negative   MEDICAL HISTORY:  Past Medical History:  Diagnosis Date   Bronchitis    Family history of adverse reaction to anesthesia    mother with nausea   Family history of breast cancer 07/31/2021   Family history of pancreatic cancer 07/31/2021   Hemorrhoid    Vaginal Pap smear, abnormal    colposcopy    SURGICAL HISTORY: Past Surgical History:  Procedure Laterality Date   BREAST LUMPECTOMY Left 08/28/2021   Procedure: LEFT BREAST LUMPECTOMY;  Surgeon: Griselda Miner, MD;  Location: [facility];  Service: General;  Laterality: Left;   NO PAST SURGERIES      SOCIAL  HISTORY: Social History   Socioeconomic History   Marital status: Single    Spouse name: Not on file   Number of children: Not on file   Years of education: Not on file   Highest education level: Not on file  Occupational History   Not on file  Tobacco Use   Smoking status: Never   Smokeless tobacco: Never  Vaping Use   Vaping Use: Never used  Substance and Sexual Activity   Alcohol use: Yes    Comment: socially   Drug use: No   Sexual activity: Yes    Birth control/protection: None  Other Topics Concern   Not on file  Social History Narrative   Not on file   Social Determinants of Health   Financial Resource Strain: Not on file  Food Insecurity: Not on file  Transportation Needs: Not on file  Physical Activity: Not on file  Stress: Not on file  Social Connections: Not on file  Intimate Partner Violence: Not on file    FAMILY HISTORY: Family History  Problem Relation Age of Onset   Arthritis Mother    Hypertension Mother    Diabetes Mother    Lung cancer Father        d. late 15s   Depression Sister    Asthma Brother    Asthma Son    Breast cancer Maternal Aunt        dx late 47s   Breast cancer Maternal Aunt  dx mid 3s   Breast cancer Paternal Aunt        dx 34s   Breast cancer Maternal Grandmother        dx 80s   Pancreatic cancer Maternal Grandmother        dx 42s   Cancer Paternal Grandmother        unknown type; dx 50s-60s   Cervical cancer Cousin        maternal cousin    ALLERGIES:  has No Known Allergies.  MEDICATIONS:  Current Outpatient Medications  Medication Sig Dispense Refill   doxycycline (VIBRA-TABS) 100 MG tablet Take 1 tablet (100 mg total) by mouth 2 (two) times daily. 14 tablet 0   famotidine (PEPCID) 20 MG tablet One after supper 30 tablet 11   fluconazole (DIFLUCAN) 150 MG tablet Take 1 tablet daily as directed; repeat after 72 hours 2 tablet 0   gabapentin (NEURONTIN) 100 MG capsule Take 1 capsule (100 mg total) by  mouth 3 (three) times daily. 90 capsule 2   ketoconazole (NIZORAL) 2 % shampoo Apply 1 application  topically 2 (two) times a week. 120 mL 0   levonorgestrel (LILETTA, 52 MG,) 20.1 MCG/DAY IUD 1 each by Intrauterine route once.     pantoprazole (PROTONIX) 40 MG tablet TAKE 1 TABLET (40 MG TOTAL) BY MOUTH DAILY. TAKE 30-60 MIN BEFORE FIRST MEAL OF THE DAY 30 tablet 0   No current facility-administered medications for this visit.    PHYSICAL EXAMINATION: ECOG PERFORMANCE STATUS: 0 - Asymptomatic  Vitals:   12/25/21 0944  BP: 126/79  Pulse: 84  Resp: 16  Temp: 97.7 F (36.5 C)  SpO2: 99%    Filed Weights   12/25/21 0944  Weight: 163 lb 11.2 oz (74.3 kg)   Physical Exam Constitutional:      Appearance: Normal appearance.  Chest:     Comments: Bilateral breasts inspected.  No concern for palpable masses.  No regional adenopathy Musculoskeletal:        General: No swelling or tenderness. Normal range of motion.     Cervical back: Normal range of motion and neck supple. No rigidity.  Lymphadenopathy:     Cervical: No cervical adenopathy.  Neurological:     General: No focal deficit present.     Mental Status: She is alert.  Psychiatric:        Mood and Affect: Mood normal.      LABORATORY DATA:  I have reviewed the data as listed Lab Results  Component Value Date   WBC 5.6 10/28/2021   HGB 13.0 10/28/2021   HCT 40.7 10/28/2021   MCV 88.8 10/28/2021   PLT 271.0 10/28/2021   Lab Results  Component Value Date   NA 138 04/29/2021   K 4.5 04/29/2021   CL 103 04/29/2021   CO2 21 04/29/2021    RADIOGRAPHIC STUDIES: I have personally reviewed the radiological reports and agreed with the findings in the report.  ASSESSMENT AND PLAN:   This is a very pleasant 36 year old premenopausal woman who was referred to high risk breast clinic given increased lifetime risk of breast cancer and given abnormal mammogram. During her initial visit we have discussed her lifetime  risk of breast cancer which comes to almost 37% per TC model however she does not qualify for tamoxifen prevention given 5-year risk of breast cancer being only 0.4%.  Her most recent breast lumpectomy did not show any evidence of malignancy.  We have once again discussed about lifestyle modification, alternating  MRIs with mammograms for breast cancer screening.  She understands that there is no clear evidence of long-term risk of gadolinium at this time however this could change.  She also understands that MRIs can be more sensitive and sometimes can lead to more biopsies. Her most recent MRI resulted in a biopsy, benign breast tissue, no pathology identified.  She is due for mammogram in November, this has been ordered today.  Physical examination today without any significant breast changes or regional adenopathy.  She was advised to continue self breast exams and report any changes.  She will return to clinic in 6 months.  We have once again discussed about role of genetic testing given her significant family history of breast cancer. She was advised to contact us with any new questions or concerns.   Rachel Moulds, MD 12/25/21

## 2021-12-31 ENCOUNTER — Other Ambulatory Visit: Payer: Self-pay | Admitting: Internal Medicine

## 2021-12-31 DIAGNOSIS — R058 Other specified cough: Secondary | ICD-10-CM

## 2022-01-14 ENCOUNTER — Other Ambulatory Visit: Payer: Self-pay | Admitting: Internal Medicine

## 2022-01-14 DIAGNOSIS — R058 Other specified cough: Secondary | ICD-10-CM

## 2022-01-25 ENCOUNTER — Ambulatory Visit: Payer: Medicaid Other | Admitting: Internal Medicine

## 2022-01-25 NOTE — Progress Notes (Deleted)
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  Was pregnant  2011   no resp problem and cough before IUP in 2020 and worse by delivery march 2021 and present daily since.      History of Present Illness  10/28/2021  Pulmonary/ 1st office eval/Cynthia Wong  Chief Complaint  Patient presents with   Consult    Patient has had a dry cough for about 2 years and some shortness of breath with exertion and going up stairs.   Dyspnea:  only with steps or bad cough  Cough: after stirs in am / no  noct/  cough is dry / has noted nose running and rx helped but not the cough  Sleep: on back/ one pillow/ flat better SABA use: ? Helps  No prednisone trial, delsym seemed to help Rec Please remember to go to the lab department   for your tests - we will call you with the results when they are available. Pantoprazole (protonix) 40 mg   Take  30-60 min before first meal of the day and Pepcid (famotidine)  20 mg after supper until return to office  GERD diet reviewed, bed blocks rec  For cough  >  delsym 2 tsp every 12 hours as much as you stop even the throat clearing   12/09/2021  f/u ov/Cynthia Wong re: cough maint on gerd rx no better dry daytime cough/ throat clearing  Chief Complaint  Patient presents with   Follow-up    Pt states that her cough has not gotten any better since last visit even after doing recommendations from last visit.  Dyspnea:  Not limited by breathing from desired activities   Cough: dry, once stirs, rare noct  Sleeping: ok  SABA use: not using/ flovent also not helpful tessalon 02: none  Covid status:   vax none/ infected twice  Rec Gabapnetin 100 mg twice daily for week for at least a  week and if not better add another at lunchtime  GERD diet    01/25/2022  f/u ov/Cynthia Wong re: ***   maint on ***  No chief complaint on file.   Dyspnea:  *** Cough: *** Sleeping: *** SABA use: *** 02: *** Covid status:   ***   No obvious day to day or daytime variability or assoc excess/ purulent sputum or mucus plugs or hemoptysis or cp or chest tightness, subjective wheeze or overt sinus or hb symptoms.   *** without nocturnal  or early am exacerbation  of respiratory  c/o's or need for noct saba. Also denies any obvious fluctuation of symptoms with weather or environmental changes or other aggravating or alleviating factors except as outlined above   No unusual exposure hx or h/o childhood pna/ asthma or knowledge of premature birth.  Current Allergies, Complete Past Medical History, Past Surgical History, Family History, and Social History were reviewed in [employer] record.  ROS  The following are not active complaints unless bolded Hoarseness, sore throat, dysphagia, dental problems, itching, sneezing,  nasal congestion or discharge of excess mucus or purulent secretions, ear ache,   fever, chills, sweats, unintended wt loss or wt gain, classically pleuritic or  exertional cp,  orthopnea pnd or arm/hand swelling  or leg swelling, presyncope, palpitations, abdominal pain, anorexia, nausea, vomiting, diarrhea  or change in bowel habits or change in bladder habits, change in stools or change in urine, dysuria, hematuria,  rash, arthralgias, visual complaints, headache, numbness, weakness or ataxia or problems with walking or coordination,  change in mood or  memory.        No outpatient medications have been marked as taking for the 01/25/22 encounter (Appointment) with Nyoka Cowden, MD.             Objective:       01/25/2022       ***   12/09/21 162 lb 6.4 oz (73.7 kg)  10/28/21 158 lb 9.6 oz (71.9 kg)  10/01/21 156 lb 14.4 oz (71.2 kg)    Vital signs  reviewed  01/25/2022  - Note at rest 02 sats  ***% on ***   General appearance:    ***         Assessment

## 2022-03-10 ENCOUNTER — Other Ambulatory Visit: Payer: Self-pay | Admitting: Family

## 2022-03-11 NOTE — Telephone Encounter (Signed)
Okay to refill shampoo  °

## 2022-03-12 MED ORDER — KETOCONAZOLE 2 % EX SHAM: 1.0000 | MEDICATED_SHAMPOO | CUTANEOUS | 0 refills | Status: DC

## 2022-05-19 DIAGNOSIS — L219 Seborrheic dermatitis, unspecified: Secondary | ICD-10-CM | POA: Diagnosis not present

## 2022-06-03 ENCOUNTER — Encounter: Payer: Self-pay | Admitting: Hematology and Oncology

## 2022-06-03 ENCOUNTER — Inpatient Hospital Stay: Payer: Medicaid Other | Attending: Hematology and Oncology | Admitting: Hematology and Oncology

## 2022-06-03 VITALS — BP 125/78 | HR 83 | Resp 18 | Ht 64.0 in | Wt 162.8 lb

## 2022-06-03 DIAGNOSIS — Z9189 Other specified personal risk factors, not elsewhere classified: Secondary | ICD-10-CM | POA: Diagnosis not present

## 2022-06-03 DIAGNOSIS — Z803 Family history of malignant neoplasm of breast: Secondary | ICD-10-CM

## 2022-06-03 DIAGNOSIS — Z1501 Genetic susceptibility to malignant neoplasm of breast: Secondary | ICD-10-CM | POA: Insufficient documentation

## 2022-06-03 NOTE — Progress Notes (Signed)
[facility] CONSULT NOTE  Patient Care Team: Olive BassMurray, Laura Woodruff, FNP as PCP - General (Internal Medicine)  CHIEF COMPLAINTS/PURPOSE OF CONSULTATION:  At high risk for breast cancer  HISTORY OF PRESENTING ILLNESS:   Cynthia Wong 36 y.o. female is here because of recent diagnosis of high risk of  breast cancer. She was seen by our genetics team Jeffie Pollockarrie Koerner and was found to have high risk of breast cancer based on Tyer Cusick model, her lifetime risk was about 37%.  She was hence recommended to follow-up in the high-risk breast clinic. She had left breast lumpectomy which showed complex sclerosing lesion with adenosis, usual ductal hyperplasia and apocrine metaplasia.  No evidence of malignancy. Last breast imaging was on Nov 22, 2021 which showed an indeterminate mass in the upper outer posterior right breast.  This was removed and showed benign breast tissue, negative for malignancy.    Since last visit, she denies any new health complaints. She is scheduled with another MRI as recommended. She hasn't felt any breast masses. Rest of the pertinent 10 point ROS reviewed and negative.   MEDICAL HISTORY:  Past Medical History:  Diagnosis Date   Bronchitis    Family history of adverse reaction to anesthesia    mother with nausea   Family history of breast cancer 07/31/2021   Family history of pancreatic cancer 07/31/2021   Hemorrhoid    Vaginal Pap smear, abnormal    colposcopy    SURGICAL HISTORY: Past Surgical History:  Procedure Laterality Date   BREAST LUMPECTOMY Left 08/28/2021   Procedure: LEFT BREAST LUMPECTOMY;  Surgeon: Griselda Mineroth, Paul III, MD;  Location: [facility];  Service: General;  Laterality: Left;   NO PAST SURGERIES      SOCIAL HISTORY: Social History   Socioeconomic History   Marital status: Single    Spouse name: Not on file   Number of children: Not on file   Years of education: Not on file   Highest education level:  Not on file  Occupational History   Not on file  Tobacco Use   Smoking status: Never   Smokeless tobacco: Never  Vaping Use   Vaping Use: Never used  Substance and Sexual Activity   Alcohol use: Yes    Comment: socially   Drug use: No   Sexual activity: Yes    Birth control/protection: None  Other Topics Concern   Not on file  Social History Narrative   Not on file   Social Determinants of Health   Financial Resource Strain: Not on file  Food Insecurity: Not on file  Transportation Needs: Not on file  Physical Activity: Not on file  Stress: Not on file  Social Connections: Not on file  Intimate Partner Violence: Not on file    FAMILY HISTORY: Family History  Problem Relation Age of Onset   Arthritis Mother    Hypertension Mother    Diabetes Mother    Lung cancer Father        d. late 5650s   Depression Sister    Asthma Brother    Asthma Son    Breast cancer Maternal Aunt        dx late 1240s   Breast cancer Maternal Aunt        dx mid 2240s   Breast cancer Paternal Aunt        dx 6850s   Breast cancer Maternal Grandmother        dx 6960s  Pancreatic cancer Maternal Grandmother        dx 62s   Cancer Paternal Grandmother        unknown type; dx 50s-60s   Cervical cancer Cousin        maternal cousin    ALLERGIES:  has No Known Allergies.  MEDICATIONS:  Current Outpatient Medications  Medication Sig Dispense Refill   doxycycline (VIBRA-TABS) 100 MG tablet Take 1 tablet (100 mg total) by mouth 2 (two) times daily. 14 tablet 0   famotidine (PEPCID) 20 MG tablet One after supper 30 tablet 11   fluconazole (DIFLUCAN) 150 MG tablet Take 1 tablet daily as directed; repeat after 72 hours 2 tablet 0   gabapentin (NEURONTIN) 100 MG capsule Take 1 capsule (100 mg total) by mouth 3 (three) times daily. 90 capsule 2   ketoconazole (NIZORAL) 2 % shampoo Apply 1 Application topically 2 (two) times a week. 120 mL 0   levonorgestrel (LILETTA, 52 MG,) 20.1 MCG/DAY IUD 1 each by  Intrauterine route once.     pantoprazole (PROTONIX) 40 MG tablet TAKE 1 TABLET (40 MG TOTAL) BY MOUTH DAILY. TAKE 30-60 MIN BEFORE FIRST MEAL OF THE DAY 90 tablet 1   No current facility-administered medications for this visit.    PHYSICAL EXAMINATION: ECOG PERFORMANCE STATUS: 0 - Asymptomatic  Vitals:   06/03/22 1508  BP: 125/78  Pulse: 83  Resp: 18  SpO2: 99%    Filed Weights   06/03/22 1508  Weight: 162 lb 12.8 oz (73.8 kg)   Physical Exam Constitutional:      Appearance: Normal appearance.  Chest:     Comments: Bilateral breasts inspected.  No concern for palpable masses.  No regional adenopathy Musculoskeletal:        General: No swelling or tenderness. Normal range of motion.     Cervical back: Normal range of motion and neck supple. No rigidity.  Lymphadenopathy:     Cervical: No cervical adenopathy.  Neurological:     General: No focal deficit present.     Mental Status: She is alert.  Psychiatric:        Mood and Affect: Mood normal.      LABORATORY DATA:  I have reviewed the data as listed Lab Results  Component Value Date   WBC 5.6 10/28/2021   HGB 13.0 10/28/2021   HCT 40.7 10/28/2021   MCV 88.8 10/28/2021   PLT 271.0 10/28/2021   Lab Results  Component Value Date   NA 138 04/29/2021   K 4.5 04/29/2021   CL 103 04/29/2021   CO2 21 04/29/2021    RADIOGRAPHIC STUDIES: I have personally reviewed the radiological reports and agreed with the findings in the report.  ASSESSMENT AND PLAN:   This is a very pleasant 36 year old premenopausal woman who was referred to high risk breast clinic given increased lifetime risk of breast cancer and given abnormal mammogram. During her initial visit we have discussed her lifetime risk of breast cancer which comes to almost 37% per TC model however she does not qualify for tamoxifen prevention given 5-year risk of breast cancer being only 0.4%.  Her most recent breast lumpectomy did not show any evidence of  malignancy.   Physical examination today without any significant breast changes or regional adenopathy.   She was advised to continue self breast exams and report any changes.   We ordered repeat MRI which is to be scheduled. RTC in 6 months or sooner as needed. She was advised to contact us  with any new questions or concerns.   Rachel Moulds, MD 06/05/22

## 2022-12-02 ENCOUNTER — Other Ambulatory Visit: Payer: Self-pay

## 2022-12-02 ENCOUNTER — Encounter: Payer: Self-pay | Admitting: Hematology and Oncology

## 2022-12-02 ENCOUNTER — Inpatient Hospital Stay: Payer: Medicaid Other | Attending: Hematology and Oncology | Admitting: Hematology and Oncology

## 2022-12-02 VITALS — BP 128/75 | HR 88 | Temp 97.5°F | Resp 17 | Wt 167.2 lb

## 2022-12-02 DIAGNOSIS — Z9189 Other specified personal risk factors, not elsewhere classified: Secondary | ICD-10-CM | POA: Diagnosis not present

## 2022-12-02 DIAGNOSIS — N6012 Diffuse cystic mastopathy of left breast: Secondary | ICD-10-CM | POA: Diagnosis not present

## 2022-12-02 DIAGNOSIS — R928 Other abnormal and inconclusive findings on diagnostic imaging of breast: Secondary | ICD-10-CM | POA: Insufficient documentation

## 2022-12-02 NOTE — Progress Notes (Signed)
[facility] CONSULT NOTE  Patient Care Team: Olive Bass, FNP as PCP - General (Internal Medicine)  CHIEF COMPLAINTS/PURPOSE OF CONSULTATION:  At high risk for breast cancer  HISTORY OF PRESENTING ILLNESS:   Cynthia Wong 37 y.o. female is here because of recent diagnosis of high risk of  breast cancer. She was seen by our genetics team Jeffie Pollock and was found to have high risk of breast cancer based on Tyer Cusick model, her lifetime risk was about 37%.  She was hence recommended to follow-up in the high-risk breast clinic. She had left breast lumpectomy which showed complex sclerosing lesion with adenosis, usual ductal hyperplasia and apocrine metaplasia.  No evidence of malignancy.  Last breast imaging was on Nov 22, 2021 which showed an indeterminate mass in the upper outer posterior right breast.  This was removed and showed benign breast tissue, negative for malignancy.    Since last visit, she denies any new health complaints.  She apparently did not get her MRI done in December.  She was stressed quite a bit in December, had very lengthy menstrual cycle and when she called to schedule her MRI, this suggested that she call them back however she did not keep up with these appointments.  She denies any breast changes today.  She is now back on regular menstrual cycles, last cycle ended on 11/29/2022.  No other health issues.  Rest of the pertinent 10 point ROS reviewed and negative.   MEDICAL HISTORY:  Past Medical History:  Diagnosis Date   Bronchitis    Family history of adverse reaction to anesthesia    mother with nausea   Family history of breast cancer 07/31/2021   Family history of pancreatic cancer 07/31/2021   Hemorrhoid    Vaginal Pap smear, abnormal    colposcopy    SURGICAL HISTORY: Past Surgical History:  Procedure Laterality Date   BREAST LUMPECTOMY Left 08/28/2021   Procedure: LEFT BREAST LUMPECTOMY;  Surgeon: Griselda Miner,  MD;  Location: [facility];  Service: General;  Laterality: Left;   NO PAST SURGERIES      SOCIAL HISTORY: Social History   Socioeconomic History   Marital status: Single    Spouse name: Not on file   Number of children: Not on file   Years of education: Not on file   Highest education level: Not on file  Occupational History   Not on file  Tobacco Use   Smoking status: Never   Smokeless tobacco: Never  Vaping Use   Vaping Use: Never used  Substance and Sexual Activity   Alcohol use: Yes    Comment: socially   Drug use: No   Sexual activity: Yes    Birth control/protection: None  Other Topics Concern   Not on file  Social History Narrative   Not on file   Social Determinants of Health   Financial Resource Strain: Not on file  Food Insecurity: Not on file  Transportation Needs: Not on file  Physical Activity: Not on file  Stress: Not on file  Social Connections: Not on file  Intimate Partner Violence: Not on file    FAMILY HISTORY: Family History  Problem Relation Age of Onset   Arthritis Mother    Hypertension Mother    Diabetes Mother    Lung cancer Father        d. late 27s   Depression Sister    Asthma Brother    Asthma Son  Breast cancer Maternal Aunt        dx late 93s   Breast cancer Maternal Aunt        dx mid 59s   Breast cancer Paternal Aunt        dx 30s   Breast cancer Maternal Grandmother        dx 42s   Pancreatic cancer Maternal Grandmother        dx 27s   Cancer Paternal Grandmother        unknown type; dx 50s-60s   Cervical cancer Cousin        maternal cousin    ALLERGIES:  has No Known Allergies.  MEDICATIONS:  Current Outpatient Medications  Medication Sig Dispense Refill   doxycycline (VIBRA-TABS) 100 MG tablet Take 1 tablet (100 mg total) by mouth 2 (two) times daily. 14 tablet 0   famotidine (PEPCID) 20 MG tablet One after supper 30 tablet 11   fluconazole (DIFLUCAN) 150 MG tablet Take 1 tablet daily as  directed; repeat after 72 hours 2 tablet 0   gabapentin (NEURONTIN) 100 MG capsule Take 1 capsule (100 mg total) by mouth 3 (three) times daily. 90 capsule 2   ketoconazole (NIZORAL) 2 % shampoo Apply 1 Application topically 2 (two) times a week. 120 mL 0   levonorgestrel (LILETTA, 52 MG,) 20.1 MCG/DAY IUD 1 each by Intrauterine route once.     pantoprazole (PROTONIX) 40 MG tablet TAKE 1 TABLET (40 MG TOTAL) BY MOUTH DAILY. TAKE 30-60 MIN BEFORE FIRST MEAL OF THE DAY 90 tablet 1   No current facility-administered medications for this visit.    PHYSICAL EXAMINATION: ECOG PERFORMANCE STATUS: 0 - Asymptomatic  Vitals:   12/02/22 1552  BP: 128/75  Pulse: 88  Resp: 17  Temp: (!) 97.5 F (36.4 C)  SpO2: 98%    Filed Weights   12/02/22 1552  Weight: 167 lb 4 oz (75.9 kg)   Physical Exam Constitutional:      Appearance: Normal appearance.  Chest:       Comments: Bilateral breasts inspected.  A cystic lesion in the left breast upper inner quadrant. No other palpable masses. No regional adenopathy. Musculoskeletal:        General: No swelling or tenderness. Normal range of motion.     Cervical back: Normal range of motion and neck supple. No rigidity.  Lymphadenopathy:     Cervical: No cervical adenopathy.  Neurological:     General: No focal deficit present.     Mental Status: She is alert.  Psychiatric:        Mood and Affect: Mood normal.      LABORATORY DATA:  I have reviewed the data as listed Lab Results  Component Value Date   WBC 5.6 10/28/2021   HGB 13.0 10/28/2021   HCT 40.7 10/28/2021   MCV 88.8 10/28/2021   PLT 271.0 10/28/2021   Lab Results  Component Value Date   NA 138 04/29/2021   K 4.5 04/29/2021   CL 103 04/29/2021   CO2 21 04/29/2021    RADIOGRAPHIC STUDIES: I have personally reviewed the radiological reports and agreed with the findings in the report.  ASSESSMENT AND PLAN:   This is a very pleasant 37 year old premenopausal woman who was  referred to high risk breast clinic given increased lifetime risk of breast cancer and given abnormal mammogram. During her initial visit we have discussed her lifetime risk of breast cancer which comes to almost 37% per TC model however she does  not qualify for tamoxifen prevention given 5-year risk of breast cancer being only 0.4%.  Recent lumpectomy did not show any evidence of malignancy.   Physical examination today without any significant breast changes or regional adenopathy except a small cystic lesion in the left breast left upper inner quadrant.  This feels very benign on exam. She was advised to continue self breast exams and report any changes.   Encouraged to schedule MRI ASAP.  We will try to do MRI every May and mammograms every November. I will try to call her back in about a month to see how she is doing and if she was able to schedule her MRI.   Rachel Moulds, MD 12/02/22

## 2022-12-04 ENCOUNTER — Telehealth: Payer: Self-pay | Admitting: Hematology and Oncology

## 2022-12-04 NOTE — Telephone Encounter (Signed)
Left patient a vm regarding upcoming appointment  

## 2022-12-09 ENCOUNTER — Other Ambulatory Visit: Payer: Self-pay | Admitting: Hematology and Oncology

## 2022-12-09 DIAGNOSIS — Z9189 Other specified personal risk factors, not elsewhere classified: Secondary | ICD-10-CM

## 2023-01-04 ENCOUNTER — Inpatient Hospital Stay: Payer: Medicaid Other | Admitting: Hematology and Oncology

## 2023-01-24 ENCOUNTER — Ambulatory Visit
Admission: RE | Admit: 2023-01-24 | Discharge: 2023-01-24 | Disposition: A | Discharge status: Home / Self Care | Payer: Medicaid Other | Source: Ambulatory Visit | Attending: Hematology and Oncology | Admitting: Hematology and Oncology

## 2023-01-24 DIAGNOSIS — Z1239 Encounter for other screening for malignant neoplasm of breast: Secondary | ICD-10-CM | POA: Diagnosis not present

## 2023-01-24 DIAGNOSIS — Z9189 Other specified personal risk factors, not elsewhere classified: Secondary | ICD-10-CM

## 2023-01-24 DIAGNOSIS — Z803 Family history of malignant neoplasm of breast: Secondary | ICD-10-CM | POA: Diagnosis not present

## 2023-01-24 MED ORDER — GADOPICLENOL 0.5 MMOL/ML IV SOLN
7.5000 mL | Freq: Once | INTRAVENOUS | Status: AC | PRN
  Administered 2023-01-24: 7.5 mL via INTRAVENOUS

## 2023-01-25 ENCOUNTER — Inpatient Hospital Stay: Payer: Medicaid Other | Attending: Hematology and Oncology | Admitting: Hematology and Oncology

## 2023-01-25 NOTE — Progress Notes (Unsigned)
[facility] CONSULT NOTE  Patient Care Team: Olive Bass, FNP as PCP - General (Internal Medicine)  CHIEF COMPLAINTS/PURPOSE OF CONSULTATION:  At high risk for breast cancer  HISTORY OF PRESENTING ILLNESS:   Cynthia Wong 37 y.o. female is here because of recent diagnosis of high risk of  breast cancer. She was seen by our genetics team Twana First and was found to have high risk of breast cancer based on Tyer Cusick model, her lifetime risk was about 37%.  She was hence recommended to follow-up in the high-risk breast clinic. She had left breast lumpectomy which showed complex sclerosing lesion with adenosis, usual ductal hyperplasia and apocrine metaplasia.  No evidence of malignancy.  Last breast imaging was on Nov 22, 2021 which showed an indeterminate mass in the upper outer posterior right breast.  This was removed and showed benign breast tissue, negative for malignancy.    Since last visit, she denies any new health complaints.  She apparently did not get her MRI done in December.  She was stressed quite a bit in December, had very lengthy menstrual cycle and when she called to schedule her MRI, this suggested that she call them back however she did not keep up with these appointments.  She denies any breast changes today.  She is now back on regular menstrual cycles, last cycle ended on 11/29/2022.  No other health issues.  Rest of the pertinent 10 point ROS reviewed and negative.   MEDICAL HISTORY:  Past Medical History:  Diagnosis Date  . Bronchitis   . Family history of adverse reaction to anesthesia    mother with nausea  . Family history of breast cancer 07/31/2021  . Family history of pancreatic cancer 07/31/2021  . Hemorrhoid   . Vaginal Pap smear, abnormal    colposcopy    SURGICAL HISTORY: Past Surgical History:  Procedure Laterality Date  . BREAST LUMPECTOMY Left 08/28/2021   Procedure: LEFT BREAST LUMPECTOMY;  Surgeon: Griselda Miner, MD;  Location: [facility];  Service: General;  Laterality: Left;  . NO PAST SURGERIES      SOCIAL HISTORY: Social History   Socioeconomic History  . Marital status: Single    Spouse name: Not on file  . Number of children: Not on file  . Years of education: Not on file  . Highest education level: Not on file  Occupational History  . Not on file  Tobacco Use  . Smoking status: Never  . Smokeless tobacco: Never  Vaping Use  . Vaping status: Never Used  Substance and Sexual Activity  . Alcohol use: Yes    Comment: socially  . Drug use: No  . Sexual activity: Yes    Birth control/protection: None  Other Topics Concern  . Not on file  Social History Narrative  . Not on file   Social Determinants of Health   Financial Resource Strain: Not on file  Food Insecurity: Not on file  Transportation Needs: Not on file  Physical Activity: Not on file  Stress: Not on file  Social Connections: Unknown (11/02/2021)   Received from [facility]   Social Network   . Social Network: Not on file  Intimate Partner Violence: Unknown (10/07/2021)   Received from [facility]   HITS   . Physically Hurt: Not on file   . Insult or Talk Down To: Not on file   . Threaten Physical Harm: Not on file   . Scream or Curse:  Not on file    FAMILY HISTORY: Family History  Problem Relation Age of Onset  . Arthritis Mother   . Hypertension Mother   . Diabetes Mother   . Lung cancer Father        d. late 33s  . Depression Sister   . Asthma Brother   . Asthma Son   . Breast cancer Maternal Aunt        dx late 82s  . Breast cancer Maternal Aunt        dx mid 42s  . Breast cancer Paternal Aunt        dx 79s  . Breast cancer Maternal Grandmother        dx 43s  . Pancreatic cancer Maternal Grandmother        dx 50s  . Cancer Paternal Grandmother        unknown type; dx 50s-60s  . Cervical cancer Cousin        maternal cousin    ALLERGIES:  has No Known  Allergies.  MEDICATIONS:  Current Outpatient Medications  Medication Sig Dispense Refill  . doxycycline (VIBRA-TABS) 100 MG tablet Take 1 tablet (100 mg total) by mouth 2 (two) times daily. 14 tablet 0  . famotidine (PEPCID) 20 MG tablet One after supper 30 tablet 11  . fluconazole (DIFLUCAN) 150 MG tablet Take 1 tablet daily as directed; repeat after 72 hours 2 tablet 0  . gabapentin (NEURONTIN) 100 MG capsule Take 1 capsule (100 mg total) by mouth 3 (three) times daily. 90 capsule 2  . ketoconazole (NIZORAL) 2 % shampoo Apply 1 Application topically 2 (two) times a week. 120 mL 0  . levonorgestrel (LILETTA, 52 MG,) 20.1 MCG/DAY IUD 1 each by Intrauterine route once.    . pantoprazole (PROTONIX) 40 MG tablet TAKE 1 TABLET (40 MG TOTAL) BY MOUTH DAILY. TAKE 30-60 MIN BEFORE FIRST MEAL OF THE DAY 90 tablet 1   No current facility-administered medications for this visit.    PHYSICAL EXAMINATION: ECOG PERFORMANCE STATUS: 0 - Asymptomatic  There were no vitals filed for this visit.   There were no vitals filed for this visit.  Physical Exam Constitutional:      Appearance: Normal appearance.  Chest:       Comments: Bilateral breasts inspected.  A cystic lesion in the left breast upper inner quadrant. No other palpable masses. No regional adenopathy. Musculoskeletal:        General: No swelling or tenderness. Normal range of motion.     Cervical back: Normal range of motion and neck supple. No rigidity.  Lymphadenopathy:     Cervical: No cervical adenopathy.  Neurological:     General: No focal deficit present.     Mental Status: She is alert.  Psychiatric:        Mood and Affect: Mood normal.     LABORATORY DATA:  I have reviewed the data as listed Lab Results  Component Value Date   WBC 5.6 10/28/2021   HGB 13.0 10/28/2021   HCT 40.7 10/28/2021   MCV 88.8 10/28/2021   PLT 271.0 10/28/2021   Lab Results  Component Value Date   NA 138 04/29/2021   K 4.5 04/29/2021    CL 103 04/29/2021   CO2 21 04/29/2021    RADIOGRAPHIC STUDIES: I have personally reviewed the radiological reports and agreed with the findings in the report.  ASSESSMENT AND PLAN:   This is a very pleasant 37 year old premenopausal woman who was referred to high  risk breast clinic given increased lifetime risk of breast cancer and given abnormal mammogram. During her initial visit we have discussed her lifetime risk of breast cancer which comes to almost 37% per TC model however she does not qualify for tamoxifen prevention given 5-year risk of breast cancer being only 0.4%.  Recent lumpectomy did not show any evidence of malignancy.   Physical examination today without any significant breast changes or regional adenopathy except a small cystic lesion in the left breast left upper inner quadrant.  This feels very benign on exam. She was advised to continue self breast exams and report any changes.   Encouraged to schedule MRI ASAP.  We will try to do MRI every May and mammograms every November. I will try to call her back in about a month to see how she is doing and if she was able to schedule her MRI.   Rachel Moulds, MD 01/25/23

## 2023-01-26 ENCOUNTER — Inpatient Hospital Stay
Admission: RE | Admit: 2023-01-26 | Discharge: 2023-01-26 | Disposition: A | Discharge status: Home / Self Care | Payer: Medicaid Other | Source: Ambulatory Visit | Attending: Family | Admitting: Family

## 2023-03-16 ENCOUNTER — Telehealth: Payer: Self-pay

## 2023-03-16 NOTE — Telephone Encounter (Signed)
  Medicaid Managed Care   Unsuccessful Outreach Note  03/16/2023 Name: Cynthia Wong MRN: [id number] DOB: 1986-02-20  Referred by: Olive Bass, FNP Reason for referral : No chief complaint on file.   An unsuccessful telephone outreach was attempted today. The patient was referred to the case management team for assistance with care management and care coordination.   Follow Up Plan: If patient returns call to provider office, please advise to call Embedded Care Management Care Guide Nicholes Rough* at [phone]*  Nicholes Rough, CMA Care Guide [employer]

## 2023-05-03 DIAGNOSIS — M545 Low back pain, unspecified: Secondary | ICD-10-CM | POA: Diagnosis not present

## 2023-05-03 DIAGNOSIS — M542 Cervicalgia: Secondary | ICD-10-CM | POA: Diagnosis not present

## 2023-06-06 ENCOUNTER — Inpatient Hospital Stay: Payer: Medicaid Other | Attending: Hematology and Oncology | Admitting: Hematology and Oncology

## 2023-06-06 ENCOUNTER — Encounter: Payer: Self-pay | Admitting: Hematology and Oncology

## 2023-06-06 VITALS — BP 96/68 | HR 86 | Temp 98.0°F | Resp 16 | Wt 163.1 lb

## 2023-06-06 DIAGNOSIS — Z9189 Other specified personal risk factors, not elsewhere classified: Secondary | ICD-10-CM

## 2023-06-06 DIAGNOSIS — Z800 Family history of malignant neoplasm of digestive organs: Secondary | ICD-10-CM | POA: Diagnosis not present

## 2023-06-06 DIAGNOSIS — M542 Cervicalgia: Secondary | ICD-10-CM | POA: Insufficient documentation

## 2023-06-06 DIAGNOSIS — Z79899 Other long term (current) drug therapy: Secondary | ICD-10-CM | POA: Insufficient documentation

## 2023-06-06 DIAGNOSIS — Z803 Family history of malignant neoplasm of breast: Secondary | ICD-10-CM | POA: Insufficient documentation

## 2023-06-06 DIAGNOSIS — M549 Dorsalgia, unspecified: Secondary | ICD-10-CM | POA: Insufficient documentation

## 2023-06-06 DIAGNOSIS — Z8049 Family history of malignant neoplasm of other genital organs: Secondary | ICD-10-CM | POA: Diagnosis not present

## 2023-06-06 DIAGNOSIS — N6012 Diffuse cystic mastopathy of left breast: Secondary | ICD-10-CM | POA: Diagnosis not present

## 2023-06-06 DIAGNOSIS — Z809 Family history of malignant neoplasm, unspecified: Secondary | ICD-10-CM | POA: Diagnosis not present

## 2023-06-06 NOTE — Progress Notes (Signed)
[facility] CONSULT NOTE  Patient Care Team: Olive Bass, FNP as PCP - General (Internal Medicine)  CHIEF COMPLAINTS/PURPOSE OF CONSULTATION:  At high risk for breast cancer  HISTORY OF PRESENTING ILLNESS:   Cynthia Wong 37 y.o. female is here because of recent diagnosis of high risk of  breast cancer. She was seen by our genetics team Jeffie Pollock and was found to have high risk of breast cancer based on Tyer Cusick model, her lifetime risk was about 37%.  She was hence recommended to follow-up in the high-risk breast clinic. She had left breast lumpectomy which showed complex sclerosing lesion with adenosis, usual ductal hyperplasia and apocrine metaplasia.  No evidence of malignancy.  Discussed the use of AI scribe software for clinical note transcription with the patient, who gave verbal consent to proceed.  History of Present Illness    The patient, with a history of a benign breast lesion, presents for a routine follow-up. She reports no changes in her breast since her last MRI in July. The patient has not noticed any changes in her breast and has not required any additional biopsies.  In addition to her breast health, the patient has been experiencing back and neck pain. She has a family history of degenerative disc disease, but recent evaluation revealed no signs of this condition. The patient's spine appears healthy, but she has been prescribed Robaxin and Flexeril for pain management and referred to physical therapy. The patient's neck pain is attributed to a straight spine, which is not easily treatable.  The patient also mentions a potential rotator cuff injury, which is being evaluated by an orthopedic doctor.  MEDICAL HISTORY:  Past Medical History:  Diagnosis Date   Bronchitis    Family history of adverse reaction to anesthesia    mother with nausea   Family history of breast cancer 07/31/2021   Family history of pancreatic cancer  07/31/2021   Hemorrhoid    Vaginal Pap smear, abnormal    colposcopy    SURGICAL HISTORY: Past Surgical History:  Procedure Laterality Date   BREAST LUMPECTOMY Left 08/28/2021   Procedure: LEFT BREAST LUMPECTOMY;  Surgeon: Griselda Miner, MD;  Location:  SURGERY CENTER;  Service: General;  Laterality: Left;   NO PAST SURGERIES      SOCIAL HISTORY: Social History   Socioeconomic History   Marital status: Single    Spouse name: Not on file   Number of children: Not on file   Years of education: Not on file   Highest education level: Not on file  Occupational History   Not on file  Tobacco Use   Smoking status: Never   Smokeless tobacco: Never  Vaping Use   Vaping status: Never Used  Substance and Sexual Activity   Alcohol use: Yes    Comment: socially   Drug use: No   Sexual activity: Yes    Birth control/protection: None  Other Topics Concern   Not on file  Social History Narrative   Not on file   Social Determinants of Health   Financial Resource Strain: Not on file  Food Insecurity: Not on file  Transportation Needs: Not on file  Physical Activity: Not on file  Stress: Not on file  Social Connections: Unknown (11/02/2021)   Received from [facility], [facility]   Social Network    Social Network: Not on file  Intimate Partner Violence: Unknown (10/07/2021)   Received from [facility], [facility]   HITS  Physically Hurt: Not on file    Insult or Talk Down To: Not on file    Threaten Physical Harm: Not on file    Scream or Curse: Not on file    FAMILY HISTORY: Family History  Problem Relation Age of Onset   Arthritis Mother    Hypertension Mother    Diabetes Mother    Lung cancer Father        d. late 42s   Depression Sister    Asthma Brother    Asthma Son    Breast cancer Maternal Aunt        dx late 63s   Breast cancer Maternal Aunt        dx mid 47s   Breast cancer Paternal Aunt        dx 7s   Breast cancer Maternal  Grandmother        dx 2s   Pancreatic cancer Maternal Grandmother        dx 4s   Cancer Paternal Grandmother        unknown type; dx 50s-60s   Cervical cancer Cousin        maternal cousin    ALLERGIES:  has No Known Allergies.  MEDICATIONS:  Current Outpatient Medications  Medication Sig Dispense Refill   doxycycline (VIBRA-TABS) 100 MG tablet Take 1 tablet (100 mg total) by mouth 2 (two) times daily. 14 tablet 0   famotidine (PEPCID) 20 MG tablet One after supper 30 tablet 11   fluconazole (DIFLUCAN) 150 MG tablet Take 1 tablet daily as directed; repeat after 72 hours 2 tablet 0   gabapentin (NEURONTIN) 100 MG capsule Take 1 capsule (100 mg total) by mouth 3 (three) times daily. 90 capsule 2   ketoconazole (NIZORAL) 2 % shampoo Apply 1 Application topically 2 (two) times a week. 120 mL 0   levonorgestrel (LILETTA, 52 MG,) 20.1 MCG/DAY IUD 1 each by Intrauterine route once.     pantoprazole (PROTONIX) 40 MG tablet TAKE 1 TABLET (40 MG TOTAL) BY MOUTH DAILY. TAKE 30-60 MIN BEFORE FIRST MEAL OF THE DAY 90 tablet 1   No current facility-administered medications for this visit.    PHYSICAL EXAMINATION: ECOG PERFORMANCE STATUS: 0 - Asymptomatic  Vitals:   06/06/23 1339  BP: 96/68  Pulse: 86  Resp: 16  Temp: 98 F (36.7 C)  SpO2: 97%    Filed Weights   06/06/23 1339  Weight: 163 lb 1.6 oz (74 kg)   Physical Exam Constitutional:      Appearance: Normal appearance.  Chest:       Comments: Bilateral breasts inspected. No palpable masses. No regional adenopathy. Musculoskeletal:        General: No swelling or tenderness. Normal range of motion.     Cervical back: Normal range of motion and neck supple. No rigidity.  Lymphadenopathy:     Cervical: No cervical adenopathy.  Neurological:     General: No focal deficit present.     Mental Status: She is alert.  Psychiatric:        Mood and Affect: Mood normal.      LABORATORY DATA:  I have reviewed the data as  listed Lab Results  Component Value Date   WBC 5.6 10/28/2021   HGB 13.0 10/28/2021   HCT 40.7 10/28/2021   MCV 88.8 10/28/2021   PLT 271.0 10/28/2021   Lab Results  Component Value Date   NA 138 04/29/2021   K 4.5 04/29/2021   CL 103 04/29/2021  CO2 21 04/29/2021    RADIOGRAPHIC STUDIES: I have personally reviewed the radiological reports and agreed with the findings in the report.  ASSESSMENT AND PLAN:   This is a very pleasant 37 year old premenopausal woman who was referred to high risk breast clinic given increased lifetime risk of breast cancer and given abnormal mammogram. During her initial visit we have discussed her lifetime risk of breast cancer which comes to almost 37% per TC model however she does not qualify for tamoxifen prevention given 5-year risk of breast cancer being only 0.4%.  Recent lumpectomy did not show any evidence of malignancy.   Physical examination today without any significant breast changes or regional adenopathy. She was advised to continue self breast exams and report any changes.   Mri in July with no evidence of malignancy. -Order mammogram for early January.  Back and Neck Pain Reports of ongoing pain. Family history of degenerative disc disease. Recent evaluation showed no disc issues, but straight spine in neck region. Currently on Robaxin and Flexeril, and prescribed physical therapy. -Continue current medications and physical therapy as prescribed by other physician.  General Health Maintenance Noted need for regular check-ups with primary care physician and gynecologist. -Encouraged to schedule appointments with primary care physician and gynecologist for routine check-ups.   Rachel Moulds, MD 06/06/23

## 2023-06-14 ENCOUNTER — Encounter: Payer: Self-pay | Admitting: Family

## 2023-06-14 ENCOUNTER — Ambulatory Visit: Payer: Medicaid Other | Admitting: Family

## 2023-06-14 VITALS — BP 120/72 | HR 81 | Temp 98.9°F | Resp 16 | Ht 64.0 in | Wt 163.0 lb

## 2023-06-14 DIAGNOSIS — J209 Acute bronchitis, unspecified: Secondary | ICD-10-CM | POA: Diagnosis not present

## 2023-06-14 MED ORDER — BENZONATATE 100 MG PO CAPS: 100.0000 mg | ORAL_CAPSULE | Freq: Three times a day (TID) | ORAL | 0 refills | Status: DC | PRN

## 2023-06-14 MED ORDER — DOXYCYCLINE HYCLATE 100 MG PO TABS: 100.0000 mg | ORAL_TABLET | Freq: Two times a day (BID) | ORAL | 0 refills | Status: DC

## 2023-06-14 MED ORDER — ALBUTEROL SULFATE HFA 108 (90 BASE) MCG/ACT IN AERS: 2.0000 | INHALATION_SPRAY | Freq: Four times a day (QID) | RESPIRATORY_TRACT | 0 refills | Status: AC | PRN

## 2023-06-14 NOTE — Progress Notes (Signed)
Cynthia Wong is a 37 y.o. female with the following history as recorded in EpicCare:  Patient Active Problem List   Diagnosis Date Noted   Upper airway cough syndrome 10/28/2021   Family history of breast cancer 07/31/2021   Family history of pancreatic cancer 07/31/2021   Genetic testing 07/31/2021   Encounter for counseling 07/20/2021   Left breast mass 03/20/2021   Asthma 09/04/2019   Dyspnea 07/25/2019   Alpha thalassemia silent carrier 06/04/2019   History of stillbirth in currently pregnant patient, unspecified trimester 03/08/2019    Current Outpatient Medications  Medication Sig Dispense Refill   albuterol (VENTOLIN HFA) 108 (90 Base) MCG/ACT inhaler Inhale 2 puffs into the lungs every 6 (six) hours as needed for wheezing or shortness of breath. 8 g 0   benzonatate (TESSALON) 100 MG capsule Take 1 capsule (100 mg total) by mouth 3 (three) times daily as needed. 20 capsule 0   doxycycline (VIBRA-TABS) 100 MG tablet Take 1 tablet (100 mg total) by mouth 2 (two) times daily. 14 tablet 0   famotidine (PEPCID) 20 MG tablet One after supper 30 tablet 11   gabapentin (NEURONTIN) 100 MG capsule Take 1 capsule (100 mg total) by mouth 3 (three) times daily. 90 capsule 2   ketoconazole (NIZORAL) 2 % shampoo Apply 1 Application topically 2 (two) times a week. 120 mL 0   levonorgestrel (LILETTA, 52 MG,) 20.1 MCG/DAY IUD 1 each by Intrauterine route once.     pantoprazole (PROTONIX) 40 MG tablet TAKE 1 TABLET (40 MG TOTAL) BY MOUTH DAILY. TAKE 30-60 MIN BEFORE FIRST MEAL OF THE DAY 90 tablet 1   albuterol (VENTOLIN HFA) 108 (90 Base) MCG/ACT inhaler Inhale into the lungs.     No current facility-administered medications for this visit.    Allergies: Patient has no known allergies.  Past Medical History:  Diagnosis Date   Bronchitis    Family history of adverse reaction to anesthesia    mother with nausea   Family history of breast cancer 07/31/2021   Family history of pancreatic  cancer 07/31/2021   Hemorrhoid    Vaginal Pap smear, abnormal    colposcopy    Past Surgical History:  Procedure Laterality Date   BREAST LUMPECTOMY Left 08/28/2021   Procedure: LEFT BREAST LUMPECTOMY;  Surgeon: Griselda Miner, MD;  Location: [facility];  Service: General;  Laterality: Left;   NO PAST SURGERIES      Family History  Problem Relation Age of Onset   Arthritis Mother    Hypertension Mother    Diabetes Mother    Lung cancer Father        d. late 52s   Depression Sister    Asthma Brother    Asthma Son    Breast cancer Maternal Aunt        dx late 58s   Breast cancer Maternal Aunt        dx mid 74s   Breast cancer Paternal Aunt        dx 63s   Breast cancer Maternal Grandmother        dx 9s   Pancreatic cancer Maternal Grandmother        dx 76s   Cancer Paternal Grandmother        unknown type; dx 50s-60s   Cervical cancer Cousin        maternal cousin    Social History   Tobacco Use   Smoking status: Never   Smokeless tobacco: Never  Substance Use Topics   Alcohol use: Yes    Comment: socially    Subjective:   2 week history of cough/ congestion; concerned about persisting cough; notes that son had similar symptoms; + productive cough; using OTC Thera Flu D/ Mucinex with limited relief;   Objective:  Vitals:   06/14/23 1115  BP: 120/72  Pulse: 81  Resp: 16  Temp: 98.9 F (37.2 C)  TempSrc: Oral  SpO2: 97%  Weight: 163 lb (73.9 kg)  Height: 5\' 4"  (1.626 m)    General: Well developed, well nourished, in no acute distress  Skin : Warm and dry.  Head: Normocephalic and atraumatic  Eyes: Sclera and conjunctiva clear; pupils round and reactive to light; extraocular movements intact  Ears: External normal; canals clear; tympanic membranes normal  Oropharynx: Pink, supple. No suspicious lesions  Neck: Supple without thyromegaly, adenopathy  Lungs: Respirations unlabored; clear to auscultation bilaterally without wheeze, rales,  rhonchi  CVS exam: normal rate and regular rhythm.  Neurologic: Alert and oriented; speech intact; face symmetrical; moves all extremities well; CNII-XII intact without focal deficit   Assessment:  1. Acute bronchitis, unspecified organism     Plan:  Rx for Doxycycline 100 mg bid x 7 days and Tessalon Perles 100 mg tid prn; refill updated on albuterol inhaler; increase fluids, rest and follow up worse, no better.   No follow-ups on file.  No orders of the defined types were placed in this encounter.   Requested Prescriptions   Signed Prescriptions Disp Refills   doxycycline (VIBRA-TABS) 100 MG tablet 14 tablet 0    Sig: Take 1 tablet (100 mg total) by mouth 2 (two) times daily.   albuterol (VENTOLIN HFA) 108 (90 Base) MCG/ACT inhaler 8 g 0    Sig: Inhale 2 puffs into the lungs every 6 (six) hours as needed for wheezing or shortness of breath.   benzonatate (TESSALON) 100 MG capsule 20 capsule 0    Sig: Take 1 capsule (100 mg total) by mouth 3 (three) times daily as needed.

## 2023-06-23 ENCOUNTER — Telehealth: Payer: Medicaid Other | Admitting: Family

## 2023-06-23 DIAGNOSIS — R053 Chronic cough: Secondary | ICD-10-CM | POA: Diagnosis not present

## 2023-06-23 MED ORDER — AZITHROMYCIN 250 MG PO TABS: ORAL_TABLET | ORAL | 0 refills | Status: DC

## 2023-06-23 MED ORDER — PREDNISONE 20 MG PO TABS: 20.0000 mg | ORAL_TABLET | Freq: Every day | ORAL | 0 refills | Status: DC

## 2023-06-23 NOTE — Progress Notes (Signed)
Cynthia Wong is a 37 y.o. female with the following history as recorded in EpicCare:  Patient Active Problem List   Diagnosis Date Noted   Upper airway cough syndrome 10/28/2021   Family history of breast cancer 07/31/2021   Family history of pancreatic cancer 07/31/2021   Genetic testing 07/31/2021   Encounter for counseling 07/20/2021   Left breast mass 03/20/2021   Asthma 09/04/2019   Dyspnea 07/25/2019   Alpha thalassemia silent carrier 06/04/2019   History of stillbirth in currently pregnant patient, unspecified trimester 03/08/2019    Current Outpatient Medications  Medication Sig Dispense Refill   azithromycin (ZITHROMAX) 250 MG tablet Take 2 tab po the first day then take 1 tablet po daily for 4 days 6 tablet 0   predniSONE (DELTASONE) 20 MG tablet Take 1 tablet (20 mg total) by mouth daily with breakfast. 5 tablet 0   albuterol (VENTOLIN HFA) 108 (90 Base) MCG/ACT inhaler Inhale 2 puffs into the lungs every 6 (six) hours as needed for wheezing or shortness of breath. 8 g 0   famotidine (PEPCID) 20 MG tablet One after supper 30 tablet 11   gabapentin (NEURONTIN) 100 MG capsule Take 1 capsule (100 mg total) by mouth 3 (three) times daily. 90 capsule 2   ketoconazole (NIZORAL) 2 % shampoo Apply 1 Application topically 2 (two) times a week. 120 mL 0   levonorgestrel (LILETTA, 52 MG,) 20.1 MCG/DAY IUD 1 each by Intrauterine route once.     pantoprazole (PROTONIX) 40 MG tablet TAKE 1 TABLET (40 MG TOTAL) BY MOUTH DAILY. TAKE 30-60 MIN BEFORE FIRST MEAL OF THE DAY 90 tablet 1   No current facility-administered medications for this visit.    Allergies: Patient has no known allergies.  Past Medical History:  Diagnosis Date   Bronchitis    Family history of adverse reaction to anesthesia    mother with nausea   Family history of breast cancer 07/31/2021   Family history of pancreatic cancer 07/31/2021   Hemorrhoid    Vaginal Pap smear, abnormal    colposcopy    Past  Surgical History:  Procedure Laterality Date   BREAST LUMPECTOMY Left 08/28/2021   Procedure: LEFT BREAST LUMPECTOMY;  Surgeon: Griselda Miner, MD;  Location: [facility];  Service: General;  Laterality: Left;   NO PAST SURGERIES      Family History  Problem Relation Age of Onset   Arthritis Mother    Hypertension Mother    Diabetes Mother    Lung cancer Father        d. late 14s   Depression Sister    Asthma Brother    Asthma Son    Breast cancer Maternal Aunt        dx late 50s   Breast cancer Maternal Aunt        dx mid 68s   Breast cancer Paternal Aunt        dx 34s   Breast cancer Maternal Grandmother        dx 63s   Pancreatic cancer Maternal Grandmother        dx 91s   Cancer Paternal Grandmother        unknown type; dx 50s-60s   Cervical cancer Cousin        maternal cousin    Social History   Tobacco Use   Smoking status: Never   Smokeless tobacco: Never  Substance Use Topics   Alcohol use: Yes    Comment: socially  Subjective:    I connected with Cynthia Wong on 06/23/23 at  2:20 PM EST by a video enabled telemedicine application and verified that I am speaking with the correct person using two identifiers.   I discussed the limitations of evaluation and management by telemedicine and the availability of in person appointments. The patient expressed understanding and agreed to proceed. Provider in office/ patient is at home; provider and patient are only 2 people on video call.   Seen on 12/10 with suspected bronchitis; treated with Doxycycline- not feeling any better; has continued to use albuterol inhaler;     Objective:  There were no vitals filed for this visit.  General: Well developed, well nourished, in no acute distress  Skin : Warm and dry.  Head: Normocephalic and atraumatic  Lungs: Respirations unlabored;  Neurologic: Alert and oriented; speech intact; face symmetrical;   Assessment:  1. Persistent cough      Plan:  Will update CXR- patient will come get at her convenience; Rx for Z-pak #1 take as directed, Rx for Prednisone 20 mg every day x 5 days; follow up to be determined.   No follow-ups on file.  Orders Placed This Encounter  Procedures   DG Chest 2 View    Standing Status:   Future    Expiration Date:   06/22/2024    Reason for Exam (SYMPTOM  OR DIAGNOSIS REQUIRED):   persistent cough    Is patient pregnant?:   No    Preferred imaging location?:   [facility]    Requested Prescriptions   Signed Prescriptions Disp Refills   azithromycin (ZITHROMAX) 250 MG tablet 6 tablet 0    Sig: Take 2 tab po the first day then take 1 tablet po daily for 4 days   predniSONE (DELTASONE) 20 MG tablet 5 tablet 0    Sig: Take 1 tablet (20 mg total) by mouth daily with breakfast.

## 2023-06-24 ENCOUNTER — Ambulatory Visit (HOSPITAL_BASED_OUTPATIENT_CLINIC_OR_DEPARTMENT_OTHER)
Admission: RE | Admit: 2023-06-24 | Discharge: 2023-06-24 | Disposition: A | Discharge status: Home / Self Care | Payer: Medicaid Other | Source: Ambulatory Visit | Attending: Family | Admitting: Family

## 2023-06-24 DIAGNOSIS — R053 Chronic cough: Secondary | ICD-10-CM | POA: Diagnosis not present

## 2023-07-14 ENCOUNTER — Ambulatory Visit
Admission: RE | Admit: 2023-07-14 | Discharge: 2023-07-14 | Disposition: A | Discharge status: Home / Self Care | Payer: Medicaid Other | Source: Ambulatory Visit | Attending: Hematology and Oncology | Admitting: Hematology and Oncology

## 2023-07-14 DIAGNOSIS — Z1231 Encounter for screening mammogram for malignant neoplasm of breast: Secondary | ICD-10-CM | POA: Diagnosis not present

## 2023-07-14 DIAGNOSIS — Z9189 Other specified personal risk factors, not elsewhere classified: Secondary | ICD-10-CM

## 2023-07-18 ENCOUNTER — Other Ambulatory Visit: Payer: Self-pay | Admitting: Hematology and Oncology

## 2023-07-18 DIAGNOSIS — R928 Other abnormal and inconclusive findings on diagnostic imaging of breast: Secondary | ICD-10-CM

## 2023-07-18 NOTE — Progress Notes (Signed)
 Diagnostic mammogram and [place] ordered.  Cynthia Wong

## 2023-07-28 ENCOUNTER — Other Ambulatory Visit: Payer: Medicaid Other

## 2023-07-28 ENCOUNTER — Ambulatory Visit (INDEPENDENT_AMBULATORY_CARE_PROVIDER_SITE_OTHER): Payer: Medicaid Other | Admitting: Family Medicine

## 2023-07-28 ENCOUNTER — Encounter: Payer: Self-pay | Admitting: Family Medicine

## 2023-07-28 ENCOUNTER — Other Ambulatory Visit (HOSPITAL_COMMUNITY)
Admission: RE | Admit: 2023-07-28 | Discharge: 2023-07-28 | Disposition: A | Discharge status: Home / Self Care | Payer: Medicaid Other | Source: Ambulatory Visit | Attending: Family Medicine | Admitting: Family Medicine

## 2023-07-28 VITALS — BP 125/80 | HR 99 | Ht 64.0 in | Wt 165.4 lb

## 2023-07-28 DIAGNOSIS — Z01419 Encounter for gynecological examination (general) (routine) without abnormal findings: Secondary | ICD-10-CM | POA: Insufficient documentation

## 2023-07-28 DIAGNOSIS — Z803 Family history of malignant neoplasm of breast: Secondary | ICD-10-CM

## 2023-07-28 DIAGNOSIS — Z1151 Encounter for screening for human papillomavirus (HPV): Secondary | ICD-10-CM

## 2023-07-28 DIAGNOSIS — Z30431 Encounter for routine checking of intrauterine contraceptive device: Secondary | ICD-10-CM

## 2023-07-28 NOTE — Progress Notes (Signed)
ANNUAL EXAM Patient name: Cynthia Wong MRN [id number]  Date of birth: [birth year] Chief Complaint:   Annual Exam  History of Present Illness:   Cynthia Wong is a 38 y.o.  [id number]  female  being seen today for a routine annual exam.  Current complaints: no complaints. Has breast screening routinely due to history of breast lump with family history of breast cancer. Has [place] beginning of February.  Patient's last menstrual period was 07/18/2023 (exact date).    Last pap 04/2021. Results were: NILM w/ HRHPV not done. H/O abnormal pap: no Last mammogram:      07/28/2023    3:15 PM 06/14/2023   11:17 AM 09/11/2021   10:12 AM  Depression screen PHQ 2/9  Decreased Interest 0 0 0  Down, Depressed, Hopeless 0 0 0  PHQ - 2 Score 0 0 0  Altered sleeping 0    Tired, decreased energy 1    Change in appetite 0    Feeling bad or failure about yourself  0    Trouble concentrating 0    Moving slowly or fidgety/restless 0    Suicidal thoughts 0    PHQ-9 Score 1          07/28/2023    3:16 PM  GAD 7 : Generalized Anxiety Score  Nervous, Anxious, on Edge 0  Control/stop worrying 0  Worry too much - different things 0  Trouble relaxing 0  Restless 0  Easily annoyed or irritable 0  Afraid - awful might happen 0  Total GAD 7 Score 0     Review of Systems:   Pertinent items are noted in HPI Denies any headaches, blurred vision, fatigue, shortness of breath, chest pain, abdominal pain, abnormal vaginal discharge/itching/odor/irritation, problems with periods, bowel movements, urination, or intercourse unless otherwise stated above. Pertinent History Reviewed:  Reviewed past medical,surgical, social and family history.  Reviewed problem list, medications and allergies. Physical Assessment:   Vitals:   07/28/23 1505  BP: 125/80  Pulse: 99  Weight: 165 lb 6.4 oz (75 kg)  Height: 5\' 4"  (1.626 m)  Body mass index is 28.39 kg/m.        Physical Examination:   General  appearance - well appearing, and in no distress  Mental status - alert, oriented to person, place, and time  Psych:  She has a normal mood and affect  Skin - warm and dry, normal color, no suspicious lesions noted  Chest - effort normal, all lung fields clear to auscultation bilaterally  Heart - normal rate and regular rhythm  Neck:  midline trachea, no thyromegaly or nodules  Breasts - breasts appear normal, no suspicious masses, no skin or nipple changes or axillary nodes  Abdomen - soft, nontender, nondistended, no masses or organomegaly  Pelvic - VULVA: normal appearing vulva with no masses, tenderness or lesions  VAGINA: normal appearing vagina with normal color and discharge, no lesions  CERVIX: normal appearing cervix without discharge or lesions, no CMT  Thin prep pap is done   IUD strings seen  UTERUS: uterus is felt to be normal size, shape, consistency and nontender   ADNEXA: No adnexal masses or tenderness noted.  Extremities:  No swelling or varicosities noted  Chaperone present for exam  Assessment & Plan:  1. Well woman exam with routine gynecological exam (Primary) No problems Discussed BRCA testing  2. IUD check up No issues  3. Family history of breast cancer Continue with screening   Labs/procedures today:  No orders of the defined types were placed in this encounter.   Meds: No orders of the defined types were placed in this encounter.   Follow-up: No follow-ups on file.  Levie Heritage, DO 07/28/2023 4:22 PM

## 2023-07-28 NOTE — Progress Notes (Signed)
 Pt. Presents for annual. Pt. Has no questions or concerns at this time.

## 2023-07-31 LAB — CERVICOVAGINAL ANCILLARY ONLY
Bacterial Vaginitis (gardnerella): NEGATIVE
Candida Glabrata: NEGATIVE
Candida Vaginitis: NEGATIVE
Chlamydia: NEGATIVE
Comment: NEGATIVE
Comment: NEGATIVE
Comment: NEGATIVE
Comment: NEGATIVE
Comment: NEGATIVE
Comment: NORMAL
Neisseria Gonorrhea: NEGATIVE
Trichomonas: NEGATIVE

## 2023-08-01 ENCOUNTER — Encounter: Payer: Self-pay | Admitting: Family Medicine

## 2023-08-02 LAB — CYTOLOGY - PAP: Diagnosis: NEGATIVE

## 2023-08-10 ENCOUNTER — Ambulatory Visit: Payer: Medicaid Other

## 2023-08-10 ENCOUNTER — Ambulatory Visit
Admission: RE | Admit: 2023-08-10 | Discharge: 2023-08-10 | Disposition: A | Discharge status: Home / Self Care | Payer: Medicaid Other | Source: Ambulatory Visit | Attending: Hematology and Oncology

## 2023-08-10 DIAGNOSIS — R928 Other abnormal and inconclusive findings on diagnostic imaging of breast: Secondary | ICD-10-CM

## 2023-08-15 DIAGNOSIS — R0602 Shortness of breath: Secondary | ICD-10-CM | POA: Diagnosis not present

## 2023-08-15 DIAGNOSIS — Z20822 Contact with and (suspected) exposure to covid-19: Secondary | ICD-10-CM | POA: Diagnosis not present

## 2023-08-15 DIAGNOSIS — R051 Acute cough: Secondary | ICD-10-CM | POA: Diagnosis not present

## 2023-12-05 ENCOUNTER — Telehealth: Payer: Self-pay

## 2023-12-05 NOTE — Telephone Encounter (Signed)
 Verbally confirmed appt for 6/3

## 2023-12-06 ENCOUNTER — Inpatient Hospital Stay: Payer: Medicaid Other | Attending: Hematology and Oncology | Admitting: Hematology and Oncology

## 2023-12-06 VITALS — BP 116/76 | HR 92 | Temp 98.1°F | Resp 16 | Ht 64.0 in | Wt 157.8 lb

## 2023-12-06 DIAGNOSIS — N6082 Other benign mammary dysplasias of left breast: Secondary | ICD-10-CM | POA: Insufficient documentation

## 2023-12-06 DIAGNOSIS — N6022 Fibroadenosis of left breast: Secondary | ICD-10-CM | POA: Diagnosis not present

## 2023-12-06 DIAGNOSIS — Z9189 Other specified personal risk factors, not elsewhere classified: Secondary | ICD-10-CM

## 2023-12-06 NOTE — Progress Notes (Signed)
 [facility] CONSULT NOTE  Patient Care Team: Adra Alanis, FNP as PCP - General (Internal Medicine)  CHIEF COMPLAINTS/PURPOSE OF CONSULTATION:  At high risk for breast cancer  HISTORY OF PRESENTING ILLNESS:   Cynthia Wong 38 y.o. female is here because of recent diagnosis of high risk of  breast cancer. She was seen by our genetics team Griffin Lecher and was found to have high risk of breast cancer based on Tyer Cusick model, her lifetime risk was about 37%. She was hence recommended to follow-up in the high-risk breast clinic. She had left breast lumpectomy which showed complex sclerosing lesion with adenosis, usual ductal hyperplasia and apocrine metaplasia.  No evidence of malignancy.  Discussed the use of AI scribe software for clinical note transcription with the patient, who gave verbal consent to proceed.  History of Present Illness    Discussed the use of AI scribe software for clinical note transcription with the patient, who gave verbal consent to proceed.  History of Present Illness Cynthia Wong "Cynthia Wong" is a 38 year old female who presents for follow-up regarding her breast health and ongoing cough.  She had a mammogram in January followed by another in February due to an initial finding in the left breast, which was not seen upon further examination. No changes in her breast health have been noted since then, and she has not noticed any new symptoms. She is due for an MRI in July.  She has been experiencing a persistent dry cough for approximately four years, which began around the time her son was born. She previously consulted with a pulmonologist who suggested it might be related to acid reflux and prescribed medication, which she discontinued due to perceived ineffectiveness. She has not seen any other doctors since her last visit and reports that her overall health has been fine aside from the cough.  She has a four-year-old son who is  described as very active and requires constant supervision. She also has an older son who is almost fourteen. She manages her household with the help of her mother.  Rest of the pertinent 10 point ROS reviewed and neg.   MEDICAL HISTORY:  Past Medical History:  Diagnosis Date   Bronchitis    Family history of adverse reaction to anesthesia    mother with nausea   Family history of breast cancer 07/31/2021   Family history of pancreatic cancer 07/31/2021   Hemorrhoid    Vaginal Pap smear, abnormal    colposcopy    SURGICAL HISTORY: Past Surgical History:  Procedure Laterality Date   BREAST LUMPECTOMY Left 08/28/2021   Procedure: LEFT BREAST LUMPECTOMY;  Surgeon: Caralyn Chandler, MD;  Location: [facility];  Service: General;  Laterality: Left;   NO PAST SURGERIES      SOCIAL HISTORY: Social History   Socioeconomic History   Marital status: Single    Spouse name: Not on file   Number of children: Not on file   Years of education: Not on file   Highest education level: Associate degree: occupational, Scientist, product/process development, or vocational program  Occupational History   Not on file  Tobacco Use   Smoking status: Never   Smokeless tobacco: Never  Vaping Use   Vaping status: Never Used  Substance and Sexual Activity   Alcohol use: Yes    Comment: socially   Drug use: No   Sexual activity: Yes    Partners: Male    Birth control/protection: I.U.D.  Other  Topics Concern   Not on file  Social History Narrative   Not on file   Social Drivers of Health   Financial Resource Strain: Low Risk  (06/13/2023)   Overall Financial Resource Strain (CARDIA)    Difficulty of Paying Living Expenses: Not hard at all  Food Insecurity: No Food Insecurity (06/13/2023)   Hunger Vital Sign    Worried About Running Out of Food in the Last Year: Never true    Ran Out of Food in the Last Year: Never true  Transportation Needs: No Transportation Needs (06/13/2023)   PRAPARE - Therapist, art (Medical): No    Lack of Transportation (Non-Medical): No  Physical Activity: Unknown (06/13/2023)   Exercise Vital Sign    Days of Exercise per Week: 0 days    Minutes of Exercise per Session: Not on file  Stress: No Stress Concern Present (06/13/2023)   [employer] of Occupational Health - Occupational Stress Questionnaire    Feeling of Stress : Not at all  Social Connections: Moderately Integrated (06/13/2023)   Social Connection and Isolation Panel [NHANES]    Frequency of Communication with Friends and Family: More than three times a week    Frequency of Social Gatherings with Friends and Family: Once a week    Attends Religious Services: 1 to 4 times per year    Active Member of [employer] or Organizations: No    Attends Engineer, structural: Not on file    Marital Status: Living with partner  Intimate Partner Violence: Unknown (10/07/2021)   Received from [facility], [facility]   HITS    Physically Hurt: Not on file    Insult or Talk Down To: Not on file    Threaten Physical Harm: Not on file    Scream or Curse: Not on file    FAMILY HISTORY: Family History  Problem Relation Age of Onset   Arthritis Mother    Hypertension Mother    Diabetes Mother    Lung cancer Father        d. late 43s   Depression Sister    Asthma Brother    Asthma Son    Breast cancer Maternal Aunt        dx late 42s   Breast cancer Maternal Aunt        dx mid 28s   Breast cancer Paternal Aunt        dx 19s   Breast cancer Maternal Grandmother        dx 71s   Pancreatic cancer Maternal Grandmother        dx 72s   Cancer Paternal Grandmother        unknown type; dx 50s-60s   Cervical cancer Cousin        maternal cousin    ALLERGIES:  has no known allergies.  MEDICATIONS:  Current Outpatient Medications  Medication Sig Dispense Refill   albuterol  (VENTOLIN  HFA) 108 (90 Base) MCG/ACT inhaler Inhale 2 puffs into the lungs every 6 (six) hours as  needed for wheezing or shortness of breath. 8 g 0   azithromycin  (ZITHROMAX ) 250 MG tablet Take 2 tab po the first day then take 1 tablet po daily for 4 days (Patient not taking: Reported on 07/28/2023) 6 tablet 0   famotidine  (PEPCID ) 20 MG tablet One after supper (Patient not taking: Reported on 07/28/2023) 30 tablet 11   gabapentin  (NEURONTIN ) 100 MG capsule Take 1 capsule (100 mg total) by mouth  3 (three) times daily. (Patient not taking: Reported on 07/28/2023) 90 capsule 2   ketoconazole  (NIZORAL ) 2 % shampoo Apply 1 Application topically 2 (two) times a week. 120 mL 0   levonorgestrel  (LILETTA , 52 MG,) 20.1 MCG/DAY IUD 1 each by Intrauterine route once.     pantoprazole  (PROTONIX ) 40 MG tablet TAKE 1 TABLET (40 MG TOTAL) BY MOUTH DAILY. TAKE 30-60 MIN BEFORE FIRST MEAL OF THE DAY (Patient not taking: Reported on 07/28/2023) 90 tablet 1   predniSONE  (DELTASONE ) 20 MG tablet Take 1 tablet (20 mg total) by mouth daily with breakfast. (Patient not taking: Reported on 07/28/2023) 5 tablet 0   No current facility-administered medications for this visit.    PHYSICAL EXAMINATION: ECOG PERFORMANCE STATUS: 0 - Asymptomatic  Vitals:   12/06/23 1401  BP: 116/76  Pulse: 92  Resp: 16  Temp: 98.1 F (36.7 C)  SpO2: 100%    Filed Weights   12/06/23 1401  Weight: 157 lb 12.8 oz (71.6 kg)   Physical Exam Constitutional:      Appearance: Normal appearance.  Chest:       Comments: Bilateral breasts inspected. No palpable masses. No regional adenopathy. Palpable post biopsy changes right breast Musculoskeletal:        General: No swelling or tenderness. Normal range of motion.     Cervical back: Normal range of motion and neck supple. No rigidity.  Lymphadenopathy:     Cervical: No cervical adenopathy.  Neurological:     General: No focal deficit present.     Mental Status: She is alert.  Psychiatric:        Mood and Affect: Mood normal.      LABORATORY DATA:  I have reviewed the  data as listed Lab Results  Component Value Date   WBC 5.6 10/28/2021   HGB 13.0 10/28/2021   HCT 40.7 10/28/2021   MCV 88.8 10/28/2021   PLT 271.0 10/28/2021   Lab Results  Component Value Date   NA 138 04/29/2021   K 4.5 04/29/2021   CL 103 04/29/2021   CO2 21 04/29/2021    RADIOGRAPHIC STUDIES: I have personally reviewed the radiological reports and agreed with the findings in the report.  ASSESSMENT AND PLAN:   This is a very pleasant 38 year old premenopausal woman who was referred to high risk breast clinic given increased lifetime risk of breast cancer and given abnormal mammogram. During her initial visit we have discussed her lifetime risk of breast cancer which comes to almost 37% per TC model however she does not qualify for tamoxifen prevention given 5-year risk of breast cancer being only 0.4%.    Assessment and Plan Assessment & Plan  At high risk for breast cancer. No concerns on exam. Post biopsy changes noted in right breast upper outer quadrant No other palpable masses. No regional adenopathy Continue mammogram alternating with MRI. Mri has been ordered.  Chronic cough Chronic dry cough for four years, possible reactive airway disease or GERD. Medication discontinued due to ineffectiveness. - Consult Dr. Vernestine Gondola for pulmonology referral assessment. - Refer to pulmonology if indicated.     Murleen Arms, MD 12/06/23

## 2023-12-08 ENCOUNTER — Telehealth: Payer: Self-pay | Admitting: Internal Medicine

## 2023-12-08 ENCOUNTER — Encounter: Payer: Self-pay | Admitting: Family

## 2023-12-08 NOTE — Telephone Encounter (Signed)
 LVM for patient reminding her the appointment on Tuesday 12/1023 at 2:45 pm is at the [facility] location in [place].  Requested call back with questions

## 2023-12-09 ENCOUNTER — Other Ambulatory Visit: Payer: Self-pay | Admitting: Family

## 2023-12-09 MED ORDER — KETOCONAZOLE 2 % EX SHAM: 1.0000 | MEDICATED_SHAMPOO | CUTANEOUS | 0 refills | Status: AC

## 2023-12-11 NOTE — Progress Notes (Unsigned)
 Cynthia Wong, female    DOB: September 22, 1985,   MRN: [id number]   Brief patient profile:  24 yobf never smoker   with recurrent bronchitis as far back as she can remember outgrew by [employer] referred to pulmonary clinic 10/28/2021 by Glade Lambert  for cough x 2020 (before covid) onset while living in a moldy house even  p moved out around 2022.  Was pregnant  2011   no resp problem and cough before IUP in 2020 and worse by delivery march 2021 and present daily since.      History of Present Illness  10/28/2021  Pulmonary/ 1st office eval/Cynthia Wong  Chief Complaint  Patient presents with   Consult    Patient has had a dry cough for about 2 years and some shortness of breath with exertion and going up stairs.   Dyspnea:  only with steps or bad cough  Cough: after stirs in am / no  noct/  cough is dry / has noted nose running and rx helped but not the cough  Sleep: on back/ one pillow/ flat better SABA use: ? Helps  No prednisone  trial, delsym seemed to help Rec Please remember to go to the lab department   for your tests - we will call you with the results when they are available. Pantoprazole  (protonix ) 40 mg   Take  30-60 min before first meal of the day and Pepcid  (famotidine )  20 mg after supper until return to office  GERD diet reviewed, bed blocks rec  For cough  >  delsym 2 tsp every 12 hours as much as you stop even the throat clearing   12/09/2021  f/u ov/Cynthia Wong re: cough maint on gerd rx  no better dry daytime cough/ throat clearing  Chief Complaint  Patient presents with   Follow-up    Pt states that her cough has not gotten any better since last visit even after doing recommendations from last visit.  Dyspnea:  Not limited by breathing from desired activities   Cough: dry, once stirs, rare noct  Sleeping: ok  SABA use: not using/ flovent  also not helpful tessalon  02: none  Covid status:   vax none/ infected twice  Rec Gabapnetin 100 mg twice daily for week for at  least a week and if not better add another at lunchtime  GERD diet reviewed, bed blocks rec    12/13/2023  f/u ov/Cynthia Wong re: cough x 2020    maint on no resp/ cough meds only   Chief Complaint  Patient presents with   Follow-up    Upper Airway Cough f/u  Dyspnea:  only with coughing fits  Cough: dry cough/ daytime incessant s pattern / uses mints all the time(gums)  Sleeping: bed is flat/ one pillow s noct or am  resp cc  SABA use: not sure it helps 02: none    No obvious day to day or daytime variability or assoc excess/ purulent sputum or mucus plugs or hemoptysis or cp or chest tightness, subjective wheeze or overt sinus or hb symptoms.    Also denies any obvious fluctuation of symptoms with weather or environmental changes or other aggravating or alleviating factors except as outlined above   No unusual exposure hx or h/o childhood pna/ asthma or knowledge of premature birth.  Current Allergies, Complete Past Medical History, Past Surgical History, Family History, and Social History were reviewed in [facility] record.  ROS  The following are not active complaints unless bolded  Hoarseness, sore throat, dysphagia, dental problems, itching, sneezing,  nasal congestion or discharge of excess mucus or purulent secretions, ear ache,   fever, chills, sweats, unintended wt loss or wt gain, classically pleuritic or exertional cp,  orthopnea pnd or arm/hand swelling  or leg swelling, presyncope, palpitations, abdominal pain, anorexia, nausea, vomiting, diarrhea  or change in bowel habits or change in bladder habits, change in stools or change in urine, dysuria, hematuria,  rash, arthralgias, visual complaints, headache, numbness, weakness or ataxia or problems with walking or coordination,  change in mood or  memory.        Current Meds  Medication Sig   albuterol  (VENTOLIN  HFA) 108 (90 Base) MCG/ACT inhaler Inhale 2 puffs into the lungs every 6 (six) hours as needed for  wheezing or shortness of breath.   ketoconazole  (NIZORAL ) 2 % shampoo Apply 1 Application topically 2 (two) times a week.   levonorgestrel  (LILETTA , 52 MG,) 20.1 MCG/DAY IUD 1 each by Intrauterine route once.              Objective:    Wts  12/13/2023        157   12/09/21 162 lb 6.4 oz (73.7 kg)  10/28/21 158 lb 9.6 oz (71.9 kg)  10/01/21 156 lb 14.4 oz (71.2 kg)    Vital signs reviewed  12/13/2023  - Note at rest 02 sats  09% on RA   General appearance:    amb healthy appearing bf with dry harsh cough early  on insp maneuvers   HEENT : Oropharynx  nl      Nasal turbinates nl    NECK :  without  apparent JVD/ palpable Nodes/TM    LUNGS: no acc muscle use,  Nl contour chest which is clear to A and P bilaterally     CV:  RRR  no s3 or murmur or increase in P2, and no edema   ABD:  soft and nontender   MS:  Gait nl   ext warm without deformities Or obvious joint restrictions  calf tenderness, cyanosis or clubbing    SKIN: warm and dry without lesions    NEURO:  alert, approp, nl sensorium with  no motor or cerebellar deficits apparent.      I personally reviewed images and agree with radiology impression as follows:  CXR:   pa and lateral 06/24/23  No active dz    Assessment

## 2023-12-13 ENCOUNTER — Encounter: Payer: Self-pay | Admitting: Internal Medicine

## 2023-12-13 ENCOUNTER — Ambulatory Visit: Admitting: Internal Medicine

## 2023-12-13 VITALS — BP 126/78 | HR 90 | Temp 97.8°F | Ht 64.0 in | Wt 157.6 lb

## 2023-12-13 DIAGNOSIS — R058 Other specified cough: Secondary | ICD-10-CM

## 2023-12-13 MED ORDER — PANTOPRAZOLE SODIUM 40 MG PO TBEC: 40.0000 mg | DELAYED_RELEASE_TABLET | Freq: Every day | ORAL | 2 refills | Status: DC

## 2023-12-13 MED ORDER — GABAPENTIN 100 MG PO CAPS: 100.0000 mg | ORAL_CAPSULE | Freq: Four times a day (QID) | ORAL | 2 refills | Status: AC

## 2023-12-13 MED ORDER — FAMOTIDINE 20 MG PO TABS: ORAL_TABLET | ORAL | 11 refills | Status: AC

## 2023-12-13 NOTE — Patient Instructions (Addendum)
 Gabapentin  100 mg three times a day for a week then every 3 days add another maximum dose is 1200 mg per day  Prednisone  10 mg take  4 each am x 2 days,   2 each am x 2 days,  1 each am x 2 days and stop   Pantoprazole  (protonix ) 40 mg   Take  30-60 min before first meal of the day and Pepcid  (famotidine )  20 mg after supper until return to office - this is the best way to tell whether stomach acid is contributing to your problem.    GERD (REFLUX)  is an extremely common cause of respiratory symptoms just like yours , many times with no obvious heartburn at all.    It can be treated with medication, but also with lifestyle changes including elevation of the head of your bed (ideally with 6 -8inch blocks under the headboard of your bed),  Smoking cessation, avoidance of late meals, excessive alcohol, and avoid fatty foods, chocolate, peppermint, colas, red wine, and acidic juices such as orange juice.  NO MINT OR MENTHOL  PRODUCTS SO NO COUGH DROPS  USE SUGARLESS CANDY INSTEAD (Jolley ranchers or Stover's or Life Savers) or even ice chips will also do - the key is to swallow to prevent all throat clearing. NO OIL BASED VITAMINS - use powdered substitutes.  Avoid fish oil when coughing.    My office will be contacting you by phone for referral to Allergy  - if you don't hear back from my office within one week please call us  back or notify us  thru MyChart and we'll address it right away.    Follow up here is as needed.

## 2023-12-14 ENCOUNTER — Encounter: Payer: Self-pay | Admitting: Internal Medicine

## 2023-12-14 NOTE — Assessment & Plan Note (Signed)
 Onset 2020 around the time of IUP and worse since on Progesterone containing IUD -  Allergy screen 10/28/2021 >  Eos 0.2 /  IgE 41   - 10/28/2021 max gerd rx plus delsym -  12/09/2021  Gabapentin  100 mg tid trial did not titrate up and stopped at 200 bidprn -  refer to Allergy 12/13/2023 at pt request (concerned with mold in appt) - 12/13/2023 repeat gabapentin  titration to max of 1200 mg per day  and added back   The standardized cough guidelines published in Chest by Slater Duncan in 2006 are still the best available and consist of a multiple step process (up to 12!) , not a single office visit,  and are intended  to address this problem logically,  with an alogrithm dependent on response to empiric treatment at  each progressive step  to determine a specific diagnosis with  minimal addtional testing needed. Therefore if adherence is an issue or can't be accurately verified,  it's very unlikely the standard evaluation and treatment will be successful here.    Furthermore, response to therapy (other than acute cough suppression, which should only be used short term with avoidance of narcotic containing cough syrups if possible), can be a gradual process for which the patient is not likely to  perceive immediate benefit.  Unlike going to an eye doctor where the best perscription is almost always the first one and is immediately effective, this is almost never the case in the management of chronic cough syndromes. Therefore the patient needs to commit up front to consistently adhere to recommendations  for up to 6 weeks of therapy directed at the likely underlying problem(s) before the response can be reasonably evaluated.   Will try again max gerd rx and gabapentin  titration seeking lowest dose that works and implementing string diet  NO MINTs  Discussed in detail all the  indications, usual  risks and alternatives  relative to the benefits with patient who agrees to proceed with Rx as outlined.              Each maintenance medication was reviewed in detail including emphasizing most importantly the difference between maintenance and prns and under what circumstances the prns are to be triggered using an action plan format where appropriate.  Total time for H and P, chart review, counseling,   and generating customized AVS unique to this office visit / same day charting =   30 min for long standing  refractory respiratory  symptoms of uncertain etiology

## 2023-12-19 ENCOUNTER — Other Ambulatory Visit: Payer: Self-pay

## 2023-12-19 MED ORDER — PREDNISONE 10 MG PO TABS: ORAL_TABLET | ORAL | 0 refills | Status: AC

## 2023-12-19 NOTE — Progress Notes (Signed)
Prednisone 10 mg take  4 each am x 2 days,   2 each am x 2 days,  1 each am x 2 days and stop  

## 2023-12-21 DIAGNOSIS — L731 Pseudofolliculitis barbae: Secondary | ICD-10-CM | POA: Diagnosis not present

## 2023-12-21 DIAGNOSIS — L219 Seborrheic dermatitis, unspecified: Secondary | ICD-10-CM | POA: Diagnosis not present

## 2024-01-04 NOTE — Progress Notes (Unsigned)
 New Patient Note  RE: Cynthia Wong MRN: [id number] DOB: [birth year] Date of Office Visit: 01/05/2024  Consult requested by: Darlean Ozell NOVAK, MD Primary care provider: Jason Leita Repine, FNP  Chief Complaint: No chief complaint on file.  History of Present Illness: I had the pleasure of seeing Cynthia Wong for initial evaluation at the [facility] on 01/05/2024. She is a 38 y.o. female, who is referred here by Jason Leita Repine, FNP for the evaluation of ***.  Discussed the use of AI scribe software for clinical note transcription with the patient, who gave verbal consent to proceed.  History of Present Illness             12/13/2023 pulm visit: Gabapentin  100 mg three times a day for a week then every 3 days add another maximum dose is 1200 mg per day   Prednisone  10 mg take  4 each am x 2 days,   2 each am x 2 days,  1 each am x 2 days and stop    Pantoprazole  (protonix ) 40 mg   Take  30-60 min before first meal of the day and Pepcid  (famotidine )  20 mg after supper until return to office - this is the best way to tell whether stomach acid is contributing to your problem.     GERD (REFLUX)  is an extremely common cause of respiratory symptoms just like yours , many times with no obvious heartburn at all.   Assessment and Plan: Cynthia Wong is a 38 y.o. female with: ***  Assessment and Plan               No follow-ups on file.  No orders of the defined types were placed in this encounter.  Lab Orders  No laboratory test(s) ordered today    Other allergy screening: Asthma: {Blank single:19197::yes,no} Rhino conjunctivitis: {Blank single:19197::yes,no} Food allergy: {Blank single:19197::yes,no} Medication allergy: {Blank single:19197::yes,no} Hymenoptera allergy: {Blank single:19197::yes,no} Urticaria: {Blank single:19197::yes,no} Eczema:{Blank single:19197::yes,no} History of recurrent  infections suggestive of immunodeficency: {Blank single:19197::yes,no}  Diagnostics: Spirometry:  Tracings reviewed. Her effort: {Blank single:19197::Good reproducible efforts.,It was hard to get consistent efforts and there is a question as to whether this reflects a maximal maneuver.,Poor effort, data can not be interpreted.} FVC: ***L FEV1: ***L, ***% predicted FEV1/FVC ratio: ***% Interpretation: {Blank single:19197::Spirometry consistent with mild obstructive disease,Spirometry consistent with moderate obstructive disease,Spirometry consistent with severe obstructive disease,Spirometry consistent with possible restrictive disease,Spirometry consistent with mixed obstructive and restrictive disease,Spirometry uninterpretable due to technique,Spirometry consistent with normal pattern,No overt abnormalities noted given today's efforts}.  Please see scanned spirometry results for details.  Skin Testing: {Blank single:19197::Select foods,Environmental allergy panel,Environmental allergy panel and select foods,Food allergy panel,None,Deferred due to recent antihistamines use}. *** Results discussed with patient/family.   Past Medical History: Patient Active Problem List   Diagnosis Date Noted   Upper airway cough syndrome 10/28/2021   Family history of breast cancer 07/31/2021   Family history of pancreatic cancer 07/31/2021   Genetic testing 07/31/2021   Encounter for counseling 07/20/2021   Left breast mass 03/20/2021   Asthma 09/04/2019   Dyspnea 07/25/2019   Alpha thalassemia silent carrier 06/04/2019   History of stillbirth in currently pregnant patient, unspecified trimester 03/08/2019   Past Medical History:  Diagnosis Date   Bronchitis    Family history of adverse reaction to anesthesia    mother with nausea   Family history of breast cancer 07/31/2021   Family history of pancreatic cancer 07/31/2021  Hemorrhoid    Vaginal Pap smear,  abnormal    colposcopy   Past Surgical History: Past Surgical History:  Procedure Laterality Date   BREAST LUMPECTOMY Left 08/28/2021   Procedure: LEFT BREAST LUMPECTOMY;  Surgeon: Curvin Mt III, MD;  Location: [facility];  Service: General;  Laterality: Left;   NO PAST SURGERIES     Medication List:  Current Outpatient Medications  Medication Sig Dispense Refill   albuterol  (VENTOLIN  HFA) 108 (90 Base) MCG/ACT inhaler Inhale 2 puffs into the lungs every 6 (six) hours as needed for wheezing or shortness of breath. 8 g 0   famotidine  (PEPCID ) 20 MG tablet One after supper 30 tablet 11   gabapentin  (NEURONTIN ) 100 MG capsule Take 1 capsule (100 mg total) by mouth 4 (four) times daily. 120 capsule 2   ketoconazole  (NIZORAL ) 2 % shampoo Apply 1 Application topically 2 (two) times a week. 120 mL 0   levonorgestrel  (LILETTA , 52 MG,) 20.1 MCG/DAY IUD 1 each by Intrauterine route once.     pantoprazole  (PROTONIX ) 40 MG tablet Take 1 tablet (40 mg total) by mouth daily. Take 30-60 min before first meal of the day 30 tablet 2   No current facility-administered medications for this visit.   Allergies: No Known Allergies Social History: Social History   Socioeconomic History   Marital status: Single    Spouse name: Not on file   Number of children: Not on file   Years of education: Not on file   Highest education level: Associate degree: occupational, Scientist, product/process development, or vocational program  Occupational History   Not on file  Tobacco Use   Smoking status: Never   Smokeless tobacco: Never  Vaping Use   Vaping status: Never Used  Substance and Sexual Activity   Alcohol use: Yes    Comment: socially   Drug use: No   Sexual activity: Yes    Partners: Male    Birth control/protection: I.U.D.  Other Topics Concern   Not on file  Social History Narrative   Not on file   Social Drivers of Health   Financial Resource Strain: Low Risk  (06/13/2023)   Overall Financial  Resource Strain (CARDIA)    Difficulty of Paying Living Expenses: Not hard at all  Food Insecurity: No Food Insecurity (06/13/2023)   Hunger Vital Sign    Worried About Running Out of Food in the Last Year: Never true    Ran Out of Food in the Last Year: Never true  Transportation Needs: No Transportation Needs (06/13/2023)   PRAPARE - Administrator, Civil Service (Medical): No    Lack of Transportation (Non-Medical): No  Physical Activity: Unknown (06/13/2023)   Exercise Vital Sign    Days of Exercise per Week: 0 days    Minutes of Exercise per Session: Not on file  Stress: No Stress Concern Present (06/13/2023)   [employer] of Occupational Health - Occupational Stress Questionnaire    Feeling of Stress : Not at all  Social Connections: Moderately Integrated (06/13/2023)   Social Connection and Isolation Panel    Frequency of Communication with Friends and Family: More than three times a week    Frequency of Social Gatherings with Friends and Family: Once a week    Attends Religious Services: 1 to 4 times per year    Active Member of [employer] or Organizations: No    Attends Banker Meetings: Not on file    Marital Status: Living with partner  Lives in a ***. Smoking: *** Occupation: ***  Environmental HistorySurveyor, minerals in the house: Network engineer in the family room: {Blank single:19197::yes,no} Carpet in the bedroom: {Blank single:19197::yes,no} Heating: {Blank single:19197::electric,gas,heat pump} Cooling: {Blank single:19197::central,window,heat pump} Pet: {Blank single:19197::yes ***,no}  Family History: Family History  Problem Relation Age of Onset   Arthritis Mother    Hypertension Mother    Diabetes Mother    Lung cancer Father        d. late 64s   Depression Sister    Asthma Brother    Asthma Son    Breast cancer Maternal Aunt        dx late 15s   Breast cancer Maternal  Aunt        dx mid 50s   Breast cancer Paternal Aunt        dx 70s   Breast cancer Maternal Grandmother        dx 32s   Pancreatic cancer Maternal Grandmother        dx 70s   Cancer Paternal Grandmother        unknown type; dx 50s-60s   Cervical cancer Cousin        maternal cousin   Problem                               Relation Asthma                                   *** Eczema                                *** Food allergy                          *** Allergic rhino conjunctivitis     ***  Review of Systems  Constitutional:  Negative for appetite change, chills, fever and unexpected weight change.  HENT:  Negative for congestion and rhinorrhea.   Eyes:  Negative for itching.  Respiratory:  Negative for cough, chest tightness, shortness of breath and wheezing.   Cardiovascular:  Negative for chest pain.  Gastrointestinal:  Negative for abdominal pain.  Genitourinary:  Negative for difficulty urinating.  Skin:  Negative for rash.  Neurological:  Negative for headaches.    Objective: There were no vitals taken for this visit. There is no height or weight on file to calculate BMI. Physical Exam Vitals and nursing note reviewed.  Constitutional:      Appearance: Normal appearance. She is well-developed.  HENT:     Head: Normocephalic and atraumatic.     Right Ear: Tympanic membrane and external ear normal.     Left Ear: Tympanic membrane and external ear normal.     Nose: Nose normal.     Mouth/Throat:     Mouth: Mucous membranes are moist.     Pharynx: Oropharynx is clear.  Eyes:     Conjunctiva/sclera: Conjunctivae normal.  Cardiovascular:     Rate and Rhythm: Normal rate and regular rhythm.     Heart sounds: Normal heart sounds. No murmur heard.    No friction rub. No gallop.  Pulmonary:     Effort: Pulmonary effort is normal.     Breath sounds: Normal breath sounds. No wheezing, rhonchi or rales.  Musculoskeletal:     Cervical back: Neck supple.  Skin:     General: Skin is warm.     Findings: No rash.  Neurological:     Mental Status: She is alert and oriented to person, place, and time.  Psychiatric:        Behavior: Behavior normal.    The plan was reviewed with the patient/family, and all questions/concerned were addressed.  It was my pleasure to see Chevella today and participate in her care. Please feel free to contact me with any questions or concerns.  Sincerely,  Orlan Cramp, DO Allergy & Immunology  [facility]  [facility] office: [phone] [facility] office: [phone]

## 2024-01-05 ENCOUNTER — Telehealth: Payer: Self-pay | Admitting: Allergy

## 2024-01-05 ENCOUNTER — Encounter: Payer: Self-pay | Admitting: Allergy

## 2024-01-05 ENCOUNTER — Other Ambulatory Visit: Payer: Self-pay

## 2024-01-05 ENCOUNTER — Ambulatory Visit (INDEPENDENT_AMBULATORY_CARE_PROVIDER_SITE_OTHER): Payer: Self-pay | Admitting: Allergy

## 2024-01-05 VITALS — BP 122/76 | HR 72 | Temp 98.1°F | Resp 18 | Ht 65.5 in | Wt 156.8 lb

## 2024-01-05 DIAGNOSIS — Z77120 Contact with and (suspected) exposure to mold (toxic): Secondary | ICD-10-CM

## 2024-01-05 DIAGNOSIS — K219 Gastro-esophageal reflux disease without esophagitis: Secondary | ICD-10-CM | POA: Diagnosis not present

## 2024-01-05 DIAGNOSIS — J3089 Other allergic rhinitis: Secondary | ICD-10-CM

## 2024-01-05 DIAGNOSIS — R053 Chronic cough: Secondary | ICD-10-CM

## 2024-01-05 MED ORDER — BUDESONIDE-FORMOTEROL FUMARATE 80-4.5 MCG/ACT IN AERO: 2.0000 | INHALATION_SPRAY | Freq: Two times a day (BID) | RESPIRATORY_TRACT | 3 refills | Status: AC

## 2024-01-05 MED ORDER — IPRATROPIUM BROMIDE 0.03 % NA SOLN: 1.0000 | Freq: Two times a day (BID) | NASAL | 3 refills | Status: DC | PRN

## 2024-01-05 NOTE — Telephone Encounter (Signed)
 Refer to ENT for chronic cough and throat clearing x 6 yrs - rule out vocal cord issues.

## 2024-01-05 NOTE — Patient Instructions (Addendum)
 Chronic cough Coughing can have multiple etiologies. Continue gabapentin  as prescribed.  Atrovent  (ipratropium) 0.03% 1-2 sprays per nostril twice a day as needed for runny nose/drainage. Refer to ENT to rule out any anatomical issues.  Daily controller medication(s): start Symbicort  80mcg 2 puffs twice a day with spacer and rinse mouth afterwards. Spacer given and demonstrated proper use with inhaler. Patient understood technique and all questions/concerned were addressed.  May use albuterol  rescue inhaler 2 puffs every 4 to 6 hours as needed for shortness of breath, chest tightness, coughing, and wheezing. May use albuterol  rescue inhaler 2 puffs 5 to 15 minutes prior to strenuous physical activities. Monitor frequency of use - if you need to use it more than twice per week on a consistent basis let us  know.  Breathing control goals:  Full participation in all desired activities (may need albuterol  before activity) Albuterol  use two times or less a week on average (not counting use with activity) Cough interfering with sleep two times or less a month Oral steroids no more than once a year No hospitalizations   Return for allergy skin testing. Will make additional recommendations based on results. Make sure you don't take any antihistamines for 3 days before the skin testing appointment. Don't put any lotion on the back and arms on the day of testing.  Must be in good health and not ill. No vaccines/injections/antibiotics within the past 7 days.  Plan on being here for 30-60 minutes.  Reflux See handout for lifestyle and dietary modifications. Continue pantoprazole  40mg  once day - nothing to eat or drink for 20-30 minutes afterwards.  Continue famotidine  at night.  Follow up for skin testing.    Follow up in 2 months to check on breathing.

## 2024-01-16 NOTE — Progress Notes (Unsigned)
 Skin testing note  RE: Cynthia Wong MRN: [id number] DOB: Mar 01, 1986 Date of Office Visit: 01/17/2024  Referring provider: Jason Leita Repine,* Primary care provider: Jason Leita Repine, FNP  Chief Complaint: skin testing  History of Present Illness: I had the pleasure of seeing Cynthia Wong for a skin testing visit at the [facility] on 01/17/2024. She is a 38 y.o. female, who is being followed for chronic cough, allergic rhinitis and GERD. Her previous allergy  office visit was on 01/05/2024 with Dr. Luke. Today is a skin testing visit.   Discussed the use of AI scribe software for clinical note transcription with the patient, who gave verbal consent to proceed.     She has a borderline positive allergy  test for peanuts but does not believe peanuts worsen her cough. She consumes peanuts regularly.     Assessment and Plan: Cynthia Wong is a 38 y.o. female with: Other allergic rhinitis Mold exposure Past history - Some PND and concerned about mold issues.  Today's skin testing positive to grass, ragweed, weed, trees. Start environmental control measures as below. Use over the counter antihistamines such as Zyrtec (cetirizine), Claritin (loratadine), Allegra (fexofenadine), or Xyzal (levocetirizine) daily as needed. May take twice a day during allergy  flares. May switch antihistamines every few months. Consider allergy  injections for long term control if above medications do not help the symptoms - handout given.   Chronic cough Past history - Chronic cough x 6 years with no clear triggers. Chronic cough likely multifactorial, possibly due to GERD, reactive airways, postnasal drip, and environmental triggers. Normal chest x-ray in 2024.  Today's skin testing positive to grass, ragweed, weed, trees. Borderline to peanuts. Avoid peanuts for 2 weeks and monitor symptoms. If no change then okay to resume.  Coughing can have multiple etiologies. Continue  gabapentin  as prescribed.  Atrovent  (ipratropium) 0.03% 1-2 sprays per nostril twice a day as needed for runny nose/drainage. Refer to ENT to rule out any anatomical issues. Daily controller medication(s): continue Symbicort  80mcg 2 puffs twice a day with spacer and rinse mouth afterwards. May use albuterol  rescue inhaler 2 puffs every 4 to 6 hours as needed for shortness of breath, chest tightness, coughing, and wheezing. May use albuterol  rescue inhaler 2 puffs 5 to 15 minutes prior to strenuous physical activities. Monitor frequency of use - if you need to use it more than twice per week on a consistent basis let us  know.    Gastroesophageal reflux disease, unspecified whether esophagitis present Continue lifestyle and dietary modifications. Continue pantoprazole  40mg  once day - nothing to eat or drink for 20-30 minutes afterwards.  Continue famotidine  at night.  Return in about 2 months (around 03/19/2024).  No orders of the defined types were placed in this encounter.  Lab Orders  No laboratory test(s) ordered today    Diagnostics: Skin Testing: Environmental allergy  panel and select foods. Today's skin testing positive to grass, ragweed, weed, trees. Borderline to peanuts. Results discussed with patient/family.  Airborne Adult Perc - 01/17/24 1047     Time Antigen Placed 1050    Allergen Manufacturer Jestine    Location Back    Number of Test 55    1. Control-Buffer 50% Glycerol Negative    2. Control-Histamine 3+    3. Bahia Negative    4. French Southern Territories Negative    5. Johnson Negative    6. [place]  Blue Negative    7. Meadow Fescue Negative    8. Perennial Rye Negative  9. Timothy Negative    10. Ragweed Mix Negative    11. Cocklebur Negative    12. Plantain,  English 2+    13. Baccharis Negative    14. Dog Fennel --   +/-   15. Russian Thistle Negative    16. Lamb's Quarters Negative    17. Sheep Sorrell Negative    18. Rough Pigweed Negative    19. Marsh Elder,  Rough Negative    20. Mugwort, Common Negative    21. Box, Elder Negative    22. Cedar, red 2+    23. Sweet Gum Negative    24. Pecan Pollen Negative    25. Pine Mix Negative    26. Walnut, Black Pollen Negative    27. Red Mulberry Negative    28. Ash Mix Negative    29. Birch Mix Negative    30. Consolidated Edison --   +/-   31. Cottonwood, Guinea-Bissau Negative    32. Hickory, White --   +/-   33. Maple Mix Negative    34. Oak, Guinea-Bissau Mix Negative    35. Sycamore Eastern Negative    36. Alternaria Alternata Negative    37. Cladosporium Herbarum Negative    38. Aspergillus Mix Negative    39. Penicillium Mix Negative    40. Bipolaris Sorokiniana (Helminthosporium) Negative    41. Drechslera Spicifera (Curvularia) Negative    42. Mucor Plumbeus Negative    43. Fusarium Moniliforme Negative    44. Aureobasidium Pullulans (pullulara) Negative    45. Rhizopus Oryzae Negative    46. Botrytis Cinera Negative    47. Epicoccum Nigrum Negative    48. Phoma Betae Negative    49. Dust Mite Mix Negative    50. Cat Hair 10,000 BAU/ml Negative    51.  Dog Epithelia Negative    52. Mixed Feathers Negative    53. Horse Epithelia Negative    54. Cockroach, German Negative    55. Tobacco Leaf Negative          Intradermal - 01/17/24 1125     Time Antigen Placed 1125    Allergen Manufacturer Jestine    Location Arm    Control 3+    Bahia Negative    French Southern Territories Negative    Johnson 2+    7 Grass Negative    Ragweed Mix 2+    Weed Mix 2+    Tree Mix Omitted    Mold 1 Negative    Mold 2 Negative    Mold 3 Negative    Mold 4 Negative    Mite Mix Negative    Cat Negative    Dog Negative    Cockroach Negative          Food Adult Perc - 01/17/24 1000     Time Antigen Placed 1050    Allergen Manufacturer Greer    Location Back    Number of allergen test 9    1. Peanut --   +/-   2. Soybean Negative    3. Wheat Negative    4. Sesame Negative    5. Milk, Cow Negative    6. Casein  Negative    7. Egg White, Chicken Negative    8. Shellfish Mix Negative    9. Fish Mix Negative          Previous notes and tests were reviewed. The plan was reviewed with the patient/family, and all questions/concerned were addressed.  It was my pleasure to see  Zanylah today and participate in her care. Please feel free to contact me with any questions or concerns.  Sincerely,  Orlan Cramp, DO Allergy  & Immunology  [facility]  [place] office: [phone] [facility] office: [phone]

## 2024-01-17 ENCOUNTER — Ambulatory Visit (INDEPENDENT_AMBULATORY_CARE_PROVIDER_SITE_OTHER): Admitting: Allergy

## 2024-01-17 ENCOUNTER — Encounter: Payer: Self-pay | Admitting: Allergy

## 2024-01-17 DIAGNOSIS — J3089 Other allergic rhinitis: Secondary | ICD-10-CM

## 2024-01-17 DIAGNOSIS — R053 Chronic cough: Secondary | ICD-10-CM

## 2024-01-17 DIAGNOSIS — K219 Gastro-esophageal reflux disease without esophagitis: Secondary | ICD-10-CM

## 2024-01-17 DIAGNOSIS — Z77120 Contact with and (suspected) exposure to mold (toxic): Secondary | ICD-10-CM

## 2024-01-17 NOTE — Patient Instructions (Addendum)
 Today's skin testing positive to grass, ragweed, weed, trees. Borderline to peanuts.  - avoid peanuts for 2 weeks and monitor symptoms. If no change then okay to resume.   Results given.  Environmental allergies Start environmental control measures as below. Use over the counter antihistamines such as Zyrtec (cetirizine), Claritin (loratadine), Allegra (fexofenadine), or Xyzal (levocetirizine) daily as needed. May take twice a day during allergy  flares. May switch antihistamines every few months. Consider allergy  injections for long term control if above medications do not help the symptoms - handout given.   Chronic cough Coughing can have multiple etiologies. Continue gabapentin  as prescribed.  Atrovent  (ipratropium) 0.03% 1-2 sprays per nostril twice a day as needed for runny nose/drainage. Refer to ENT to rule out any anatomical issues.  Daily controller medication(s): continue Symbicort  80mcg 2 puffs twice a day with spacer and rinse mouth afterwards. May use albuterol  rescue inhaler 2 puffs every 4 to 6 hours as needed for shortness of breath, chest tightness, coughing, and wheezing. May use albuterol  rescue inhaler 2 puffs 5 to 15 minutes prior to strenuous physical activities. Monitor frequency of use - if you need to use it more than twice per week on a consistent basis let us  know.  Breathing control goals:  Full participation in all desired activities (may need albuterol  before activity) Albuterol  use two times or less a week on average (not counting use with activity) Cough interfering with sleep two times or less a month Oral steroids no more than once a year No hospitalizations   Reflux Continue lifestyle and dietary modifications. Continue pantoprazole  40mg  once day - nothing to eat or drink for 20-30 minutes afterwards.  Continue famotidine  at night.  Follow up in 2 months or sooner if needed.   Reducing Pollen Exposure Pollen seasons: trees (spring), grass (summer)  and ragweed/weeds (fall). Keep windows closed in your home and car to lower pollen exposure.  Install air conditioning in the bedroom and throughout the house if possible.  Avoid going out in dry windy days - especially early morning. Pollen counts are highest between 5 - 10 AM and on dry, hot and windy days.  Save outside activities for late afternoon or after a heavy rain, when pollen levels are lower.  Avoid mowing of grass if you have grass pollen allergy . Be aware that pollen can also be transported indoors on people and pets.  Dry your clothes in an automatic dryer rather than hanging them outside where they might collect pollen.  Rinse hair and eyes before bedtime.

## 2024-01-23 ENCOUNTER — Ambulatory Visit (HOSPITAL_COMMUNITY)
Admission: RE | Admit: 2024-01-23 | Discharge: 2024-01-23 | Disposition: A | Discharge status: Home / Self Care | Source: Ambulatory Visit | Attending: Hematology and Oncology | Admitting: Hematology and Oncology

## 2024-01-23 DIAGNOSIS — Z9189 Other specified personal risk factors, not elsewhere classified: Secondary | ICD-10-CM | POA: Insufficient documentation

## 2024-01-23 DIAGNOSIS — D242 Benign neoplasm of left breast: Secondary | ICD-10-CM | POA: Diagnosis not present

## 2024-01-23 DIAGNOSIS — R92323 Mammographic fibroglandular density, bilateral breasts: Secondary | ICD-10-CM | POA: Diagnosis not present

## 2024-01-23 DIAGNOSIS — Z1239 Encounter for other screening for malignant neoplasm of breast: Secondary | ICD-10-CM | POA: Diagnosis not present

## 2024-01-23 DIAGNOSIS — C50919 Malignant neoplasm of unspecified site of unspecified female breast: Secondary | ICD-10-CM | POA: Diagnosis not present

## 2024-01-23 MED ORDER — GADOBUTROL 1 MMOL/ML IV SOLN
7.0000 mL | Freq: Once | INTRAVENOUS | Status: AC | PRN
  Administered 2024-01-23: 7 mL via INTRAVENOUS

## 2024-01-24 ENCOUNTER — Encounter (INDEPENDENT_AMBULATORY_CARE_PROVIDER_SITE_OTHER): Payer: Self-pay

## 2024-02-21 NOTE — Telephone Encounter (Signed)
 KeKe has been scheduled with Dr. Soldatova for 8/22 at 8:45 am.

## 2024-02-24 ENCOUNTER — Institutional Professional Consult (permissible substitution) (INDEPENDENT_AMBULATORY_CARE_PROVIDER_SITE_OTHER): Admitting: Otolaryngology

## 2024-02-29 ENCOUNTER — Encounter (INDEPENDENT_AMBULATORY_CARE_PROVIDER_SITE_OTHER): Payer: Self-pay | Admitting: Otolaryngology

## 2024-02-29 ENCOUNTER — Ambulatory Visit (INDEPENDENT_AMBULATORY_CARE_PROVIDER_SITE_OTHER): Admitting: Otolaryngology

## 2024-02-29 VITALS — BP 126/76 | HR 89

## 2024-02-29 DIAGNOSIS — R0982 Postnasal drip: Secondary | ICD-10-CM

## 2024-02-29 DIAGNOSIS — K219 Gastro-esophageal reflux disease without esophagitis: Secondary | ICD-10-CM

## 2024-02-29 DIAGNOSIS — R053 Chronic cough: Secondary | ICD-10-CM

## 2024-02-29 DIAGNOSIS — J3089 Other allergic rhinitis: Secondary | ICD-10-CM

## 2024-02-29 DIAGNOSIS — R0981 Nasal congestion: Secondary | ICD-10-CM

## 2024-02-29 NOTE — Progress Notes (Signed)
 ENT CONSULT:  Reason for Consult: chronic cough    HPI: Discussed the use of AI scribe software for clinical note transcription with the patient, who gave verbal consent to proceed.  History of Present Illness Cynthia Wong Cynthia Wong is a 38 year old female who presents with chronic cough.  She has been experiencing a chronic dry cough that began several years ago after moving from [place] to [place]. The cough persists despite various treatments and is exacerbated by physical activity, such as going up and down stairs or cleaning.  She has no history of heartburn or reflux symptoms. She has been on gabapentin , prescribed by a previous provider, but has not noticed any significant improvement in her cough. She feels 'a little tired' as a side effect of the medication. She is also taking pantoprazole  in the morning and famotidine  at night for reflux, but these have not altered the frequency of her cough.  Allergy  testing revealed sensitivities to pollens, trees, and grass. She is currently using budesonide  nasal spray, two pumps in the morning and two at night, as part of her allergy  management plan.  She denies any sensation of throat closure or noisy breathing during coughing episodes.   Records Reviewed:  Dr [facility]  office visit 01/05/24  I had the pleasure of seeing Cynthia Wong for initial evaluation at the [facility] on 01/05/2024. She is a 38 y.o. female, who is referred here by Dr. Darlean (pulmonology) for the evaluation of coughing.   Discussed the use of AI scribe software for clinical note transcription with the patient, who gave verbal consent to proceed.    She has experienced a chronic dry cough for six years, characterized by frequent throat clearing. The cough began after moving from [place] to [place], where she has lived in two houses with mold issues. Despite remediation efforts, the cough persists.   She takes  pantoprazole  in the morning, gabapentin  four times a day, and famotidine  at night, though she is unsure of their efficacy. She previously stopped gabapentin  due to perceived ineffectiveness but recently resumed it. She uses a Ventolin  inhaler as needed, most recently a couple of weeks ago, but finds it does not significantly help. She has not used a daily inhaler or prednisone  for this issue, although prednisone  was prescribed recently.   No shortness of breath, chest tightness, or waking up at night due to the cough. Wheezing occurs sometimes, and the cough is present throughout the day, worsening with physical activity or cleaning. She has a history of seasonal allergies with symptoms like stuffy nose and postnasal drip during season changes, but has not been on allergy  medications or undergone allergy  testing.   She denies smoking, has no pets, and reports no recent fevers, chills, or rashes. She maintains a normal appetite and bowel habits.     12/13/2023 pulm visit: Gabapentin  100 mg three times a day for a week then every 3 days add another maximum dose is 1200 mg per day   Prednisone  10 mg take  4 each am x 2 days,   2 each am x 2 days,  1 each am x 2 days and stop    Pantoprazole  (protonix ) 40 mg   Take  30-60 min before first meal of the day and Pepcid  (famotidine )  20 mg after supper until return to office - this is the best way to tell whether stomach acid is contributing to your problem.     GERD (REFLUX)  is an extremely common cause of respiratory symptoms just like yours , many times with no obvious heartburn at all.    Past Medical History:  Diagnosis Date   Bronchitis    Family history of adverse reaction to anesthesia    mother with nausea   Family history of breast cancer 07/31/2021   Family history of pancreatic cancer 07/31/2021   Hemorrhoid    Vaginal Pap smear, abnormal    colposcopy    Past Surgical History:  Procedure Laterality Date   BREAST LUMPECTOMY Left  08/28/2021   Procedure: LEFT BREAST LUMPECTOMY;  Surgeon: Curvin Deward MOULD, MD;  Location: [facility];  Service: General;  Laterality: Left;   NO PAST SURGERIES      Family History  Problem Relation Age of Onset   Arthritis Mother    Hypertension Mother    Diabetes Mother    Lung cancer Father        d. late 13s   Depression Sister    Asthma Brother    Asthma Son    Breast cancer Maternal Aunt        dx late 60s   Breast cancer Maternal Aunt        dx mid 93s   Breast cancer Paternal Aunt        dx 32s   Breast cancer Maternal Grandmother        dx 5s   Pancreatic cancer Maternal Grandmother        dx 71s   Cancer Paternal Grandmother        unknown type; dx 50s-60s   Cervical cancer Cousin        maternal cousin    Social History:  reports that she has never smoked. She has never used smokeless tobacco. She reports current alcohol use. She reports that she does not use drugs.  Allergies: No Known Allergies  Medications: I have reviewed the patient's current medications.  The PMH, PSH, Medications, Allergies, and SH were reviewed and updated.  ROS: Constitutional: Negative for fever, weight loss and weight gain. Cardiovascular: Negative for chest pain and dyspnea on exertion. Respiratory: Is not experiencing shortness of breath at rest. Gastrointestinal: Negative for nausea and vomiting. Neurological: Negative for headaches. Psychiatric: The patient is not nervous/anxious  Blood pressure 126/76, pulse 89, SpO2 98%. There is no height or weight on file to calculate BMI.  PHYSICAL EXAM:  Exam: General: Well-developed, well-nourished  Respiratory Respiratory effort: Equal inspiration and expiration without stridor Cardiovascular Peripheral Vascular: Warm extremities with equal color/perfusion Eyes: No nystagmus with equal extraocular motion bilaterally Neuro/Psych/Balance: Patient oriented to person, place, and time; Appropriate mood and affect;  Gait is intact with no imbalance; Cranial nerves I-XII are intact Head and Face Inspection: Normocephalic and atraumatic without mass or lesion Palpation: Facial skeleton intact without bony stepoffs Salivary Glands: No mass or tenderness Facial Strength: Facial motility symmetric and full bilaterally ENT Pinna: External ear intact and fully developed External canal: Canal is patent with intact skin Tympanic Membrane: Clear and mobile External Nose: No scar or anatomic deformity Internal Nose: Septum is straight. No polyp, or purulence. Mucosal edema and erythema present.  Bilateral inferior turbinate hypertrophy.  Lips, Teeth, and gums: Mucosa and teeth intact and viable TMJ: No pain to palpation with full mobility Oral cavity/oropharynx: No erythema or exudate, no lesions present Nasopharynx: No mass or lesion with intact mucosa Hypopharynx: Intact mucosa without pooling of secretions Larynx Glottic: Full true vocal cord mobility without lesion or  mass Supraglottic: Normal appearing epiglottis and AE folds Interarytenoid Space: Moderate pachydermia&edema Subglottic Space: Patent without lesion or edema Neck Neck and Trachea: Midline trachea without mass or lesion Thyroid : No mass or nodularity Lymphatics: No lymphadenopathy  Procedure: Preoperative diagnosis: chronic cough   Postoperative diagnosis:   Same  Procedure: Flexible fiberoptic laryngoscopy  Surgeon: Elena Larry, MD  Anesthesia: Topical lidocaine  and Afrin Complications: None Condition is stable throughout exam  Indications and consent:  The patient presents to the clinic with above symptoms. Indirect laryngoscopy view was incomplete. Thus it was recommended that they undergo a flexible fiberoptic laryngoscopy. All of the risks, benefits, and potential complications were reviewed with the patient preoperatively and verbal informed consent was obtained.  Procedure: The patient was seated upright in the  clinic. Topical lidocaine  and Afrin were applied to the nasal cavity. After adequate anesthesia had occurred, I then proceeded to pass the flexible telescope into the nasal cavity. The nasal cavity was patent without rhinorrhea or polyp. The nasopharynx was also patent without mass or lesion. The base of tongue was visualized and was normal. There were no signs of pooling of secretions in the piriform sinuses. The true vocal folds were mobile bilaterally. There were no signs of glottic or supraglottic mucosal lesion or mass. There was moderate interarytenoid pachydermia and post cricoid edema. The telescope was then slowly withdrawn and the patient tolerated the procedure throughout.      Studies Reviewed: CXR 06/24/23 FINDINGS: Cardiac silhouette and mediastinal contours are within normal limits. The lungs are clear. No pleural effusion or pneumothorax. No acute skeletal abnormality.   IMPRESSION: No active cardiopulmonary disease.  Assessment/Plan: Encounter Diagnoses  Name Primary?   Chronic cough Yes   Chronic nasal congestion    Post-nasal drip    Chronic GERD    Environmental and seasonal allergies     Assessment and Plan Assessment & Plan Chronic cough  I discussed common etiologies of chronic cough including GERD/allergies and PND and lung disease with the patient and explained that in most cases the cause of chronic cough is multi-factorial. She had negative CXR and already saw Pulm and Allergy . Chronic cough likely has neurogenic component and reflux can exacerbate her cough. Gabapentin  ineffective, with poor compliance and fatigue as a side effect. Differential includes untreated reflux and postnasal drainage with neurogenic component.  - Discussed superior laryngeal nerve block for neurogenic cough. Explained steroid and lidocaine  injection process, potential for up to five injections, and possible partial response after one or two injections. - Schedule superior laryngeal  nerve block in two weeks. - Provide after visit summary with procedure details.  Gastroesophageal reflux disease (GERD) LPR Evidence of changes c/w GERD LPR on scope exam today.  GERD with persistent cough despite pantoprazole  and famotidine . - continue Pantoprazole  and Pepcid  -  Reflux Gourmet after meals - diet and lifestyle changes to minimize GERD - Refer to [employer] blog for dietary and lifestyle modifications/reflux cook book  Chronic nasal congestion PND and Allergies based on testing  - continue Budesonide  nasal spray and see Allergy  as scheduled   RTC for SLN block       Thank you for allowing me to participate in the care of this patient. Please do not hesitate to contact me with any questions or concerns.   Elena Larry, MD Otolaryngology [facility] Phone: [phone] Fax: [phone]    02/29/2024, 3:06 PM

## 2024-02-29 NOTE — Patient Instructions (Addendum)
 [email or web address] - check out this website to learn more about reflux   -Avoid lying down for at least two hours after a meal or after drinking acidic beverages, like soda, or other caffeinated beverages. This can help to prevent stomach contents from flowing back into the esophagus. -Keep your head elevated while you sleep. Using an extra pillow or two can also help to prevent reflux. -Eat smaller and more frequent meals each day instead of a few large meals. This promotes digestion and can aid in preventing heartburn. -Wear loose-fitting clothes to ease pressure on the stomach, which can worsen heartburn and reflux. -Reduce excess weight around the midsection. This can ease pressure on the stomach. Such pressure can force some stomach contents back up the esophagus   - Take Reflux Gourmet (natural supplement available on [employer]) to help with symptoms of chronic throat irritation      Superior laryngeal nerve block for chronic cough, throat clearing, or pain  Some patients have symptoms from inflammation or hypersensitivity of the nerve that provides sensation to the inside of the throat. This nerve enters the throat right above the Adam's apple, and there is one on each side. It is called the "superior laryngeal nerve."  An injection of lidocaine and steroid can be given to this area where the sensory nerve enters the throat.  This sometimes helps patients with an oversensitive nerve or cough reflex. It "resets" an abnormal cough threshold or overactive pain signals from the throat. The steroid acts to decrease any inflammation around the nerve that could be adding to this hypersensitivity.  The injection may help right away, or it may take up to a week or two to start working.  If it hasn't helped at all after 2-3 weeks, Dr Tabithia Stroder will often try a second injection.  Some patients with no response to the first injection still have a good response to the second one.  There is no set  schedule for repeating these injections.  It is based entirely on how long they help your symptoms.  If you find the injection helpful but the symptoms come back several months later, then you can repeat it at that time.  Some patients never need a second injection.  Side effects from the injection are mostly related to numbness inside the throat.  If this happens, you may feel one or both sides of your throat go numb. This will last about 1 hour, and you should not swallow anything until the sensation returns to normal, usually about an hour.  It is sometimes frightening to patients when the sensation inside their throat changes, but it is not dangerous.  You may choose to stay in our waiting room until the feeling goes away, but this is not a requirement.  If this happens, it will improve gradually over the hour after the procedure. Other risks include accidental injection into the blood vessels, pain, temporary swallowing trouble or voice changes, and side effects related to steroids generally, like raised blood sugar or increased appetite. Many patients do not notice any steroid-related side effects from the injection, but if you have never taken steroids or aren't familiar with their effects, you should ask Dr Kassiah Mccrory for additional details.  Diabetics shouyld plan to check their blood sugar more frequently in the 2 days after the injection.  The injection takes approximately 1 minute per side, and\ it is performed during a routine office visit. There are no precautions afterward except for not swallowing for 1 hour if  throat numbness occurs. Patients can drive themselves to and from the visit. There is no recovery time, but some patients have mild bruising or tenderness at the injection site.

## 2024-03-08 ENCOUNTER — Ambulatory Visit: Admitting: Allergy

## 2024-03-08 NOTE — Progress Notes (Deleted)
 Follow Up Note  RE: Cynthia Wong MRN: [id number] DOB: Dec 28, 1985 Date of Office Visit: 03/08/2024  Referring provider: Jason Leita Repine,* Primary care provider: Jason Leita Repine, FNP  Chief Complaint: No chief complaint on file.  History of Present Illness: I had the pleasure of seeing Cynthia Wong for a follow up visit at the [facility] on 03/08/2024. She is a 38 y.o. female, who is being followed for allergic rhinitis, cough, GERD. Her previous allergy  office visit was on 01/17/2024 with Dr. Luke. Today is a regular follow up visit.  Discussed the use of AI scribe software for clinical note transcription with the patient, who gave verbal consent to proceed.  History of Present Illness            ***  Assessment and Plan: Cynthia Wong is a 38 y.o. female with: Other allergic rhinitis Mold exposure Past history - Some PND and concerned about mold issues.  Today's skin testing positive to grass, ragweed, weed, trees. Start environmental control measures as below. Use over the counter antihistamines such as Zyrtec (cetirizine), Claritin (loratadine), Allegra (fexofenadine), or Xyzal (levocetirizine) daily as needed. May take twice a day during allergy  flares. May switch antihistamines every few months. Consider allergy  injections for long term control if above medications do not help the symptoms - handout given.    Chronic cough Past history - Chronic cough x 6 years with no clear triggers. Chronic cough likely multifactorial, possibly due to GERD, reactive airways, postnasal drip, and environmental triggers. Normal chest x-ray in 2024.  Today's skin testing positive to grass, ragweed, weed, trees. Borderline to peanuts. Avoid peanuts for 2 weeks and monitor symptoms. If no change then okay to resume.  Coughing can have multiple etiologies. Continue gabapentin  as prescribed.  Atrovent  (ipratropium) 0.03% 1-2 sprays per nostril twice a day as  needed for runny nose/drainage. Refer to ENT to rule out any anatomical issues. Daily controller medication(s): continue Symbicort  80mcg 2 puffs twice a day with spacer and rinse mouth afterwards. May use albuterol  rescue inhaler 2 puffs every 4 to 6 hours as needed for shortness of breath, chest tightness, coughing, and wheezing. May use albuterol  rescue inhaler 2 puffs 5 to 15 minutes prior to strenuous physical activities. Monitor frequency of use - if you need to use it more than twice per week on a consistent basis let us  know.    Gastroesophageal reflux disease, unspecified whether esophagitis present Continue lifestyle and dietary modifications. Continue pantoprazole  40mg  once day - nothing to eat or drink for 20-30 minutes afterwards.  Continue famotidine  at night. Assessment and Plan              No follow-ups on file.  No orders of the defined types were placed in this encounter.  Lab Orders  No laboratory test(s) ordered today    Diagnostics: Spirometry:  Tracings reviewed. Her effort: {Blank single:19197::Good reproducible efforts.,It was hard to get consistent efforts and there is a question as to whether this reflects a maximal maneuver.,Poor effort, data can not be interpreted.} FVC: ***L FEV1: ***L, ***% predicted FEV1/FVC ratio: ***% Interpretation: {Blank single:19197::Spirometry consistent with mild obstructive disease,Spirometry consistent with moderate obstructive disease,Spirometry consistent with severe obstructive disease,Spirometry consistent with possible restrictive disease,Spirometry consistent with mixed obstructive and restrictive disease,Spirometry uninterpretable due to technique,Spirometry consistent with normal pattern,No overt abnormalities noted given today's efforts}.  Please see scanned spirometry results for details.  Skin Testing: {Blank single:19197::Select foods,Environmental allergy  panel,Environmental allergy   panel and select  foods,Food allergy  panel,None,Deferred due to recent antihistamines use}. *** Results discussed with patient/family.   Medication List:  Current Outpatient Medications  Medication Sig Dispense Refill  . albuterol  (VENTOLIN  HFA) 108 (90 Base) MCG/ACT inhaler Inhale 2 puffs into the lungs every 6 (six) hours as needed for wheezing or shortness of breath. 8 g 0  . budesonide -formoterol  (SYMBICORT ) 80-4.5 MCG/ACT inhaler Inhale 2 puffs into the lungs in the morning and at bedtime. with spacer and rinse mouth afterwards. 1 each 3  . famotidine  (PEPCID ) 20 MG tablet One after supper 30 tablet 11  . gabapentin  (NEURONTIN ) 100 MG capsule Take 1 capsule (100 mg total) by mouth 4 (four) times daily. 120 capsule 2  . ipratropium (ATROVENT ) 0.03 % nasal spray Place 1-2 sprays into both nostrils 2 (two) times daily as needed (nasal drainage). 30 mL 3  . ketoconazole  (NIZORAL ) 2 % shampoo Apply 1 Application topically 2 (two) times a week. 120 mL 0  . levonorgestrel  (LILETTA , 52 MG,) 20.1 MCG/DAY IUD 1 each by Intrauterine route once.    . pantoprazole  (PROTONIX ) 40 MG tablet Take 1 tablet (40 mg total) by mouth daily. Take 30-60 min before first meal of the day 30 tablet 2   No current facility-administered medications for this visit.   Allergies: No Known Allergies I reviewed her past medical history, social history, family history, and environmental history and no significant changes have been reported from her previous visit.  Review of Systems  Constitutional:  Negative for appetite change, chills, fever and unexpected weight change.  HENT:  Positive for postnasal drip. Negative for congestion and rhinorrhea.   Eyes:  Negative for itching.  Respiratory:  Positive for cough. Negative for chest tightness, shortness of breath and wheezing.   Cardiovascular:  Negative for chest pain.  Gastrointestinal:  Negative for abdominal pain.  Genitourinary:  Negative for difficulty  urinating.  Skin:  Negative for rash.  Allergic/Immunologic: Positive for environmental allergies.  Neurological:  Negative for headaches.    Objective: There were no vitals taken for this visit. There is no height or weight on file to calculate BMI. Physical Exam Vitals and nursing note reviewed.  Constitutional:      Appearance: Normal appearance. She is well-developed.  HENT:     Head: Normocephalic and atraumatic.     Right Ear: Tympanic membrane and external ear normal.     Left Ear: Tympanic membrane and external ear normal.     Nose: Nose normal.     Mouth/Throat:     Mouth: Mucous membranes are moist.     Pharynx: Oropharynx is clear.  Eyes:     Conjunctiva/sclera: Conjunctivae normal.  Cardiovascular:     Rate and Rhythm: Normal rate and regular rhythm.     Heart sounds: Normal heart sounds. No murmur heard.    No friction rub. No gallop.  Pulmonary:     Effort: Pulmonary effort is normal.     Breath sounds: Normal breath sounds. No wheezing, rhonchi or rales.  Musculoskeletal:     Cervical back: Neck supple.  Skin:    General: Skin is warm.     Findings: No rash.  Neurological:     Mental Status: She is alert and oriented to person, place, and time.  Psychiatric:        Behavior: Behavior normal.   Previous notes and tests were reviewed. The plan was reviewed with the patient/family, and all questions/concerned were addressed.  It was my pleasure to see Cynthia Wong today and  participate in her care. Please feel free to contact me with any questions or concerns.  Sincerely,  Orlan Cramp, DO Allergy  & Immunology  [facility]  [place] office: [phone] [facility] office: [phone]

## 2024-03-09 ENCOUNTER — Other Ambulatory Visit: Payer: Self-pay | Admitting: Internal Medicine

## 2024-03-15 ENCOUNTER — Encounter (INDEPENDENT_AMBULATORY_CARE_PROVIDER_SITE_OTHER): Payer: Self-pay | Admitting: Otolaryngology

## 2024-03-15 ENCOUNTER — Ambulatory Visit (INDEPENDENT_AMBULATORY_CARE_PROVIDER_SITE_OTHER): Admitting: Otolaryngology

## 2024-03-15 VITALS — BP 124/82 | HR 84

## 2024-03-15 DIAGNOSIS — R053 Chronic cough: Secondary | ICD-10-CM | POA: Diagnosis not present

## 2024-03-15 NOTE — Progress Notes (Signed)
 Procedure note: Superior laryngeal nerve block (35591-49) and steroid injection  ATTENDING: Elena Larry, M.D.  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 The risks and benefits of the surgical procedure have been explained in detail to the patient and they have elected to proceed.  CONSENT:  Informed consent was obtained prior to the procedure after discussion of risks, benefits, and alternatives and expected outcomes were discussed with the patient; consent placed in chart. The possibilities of reaction to medication, increase in pain, bleeding, infection, prolonged numbness or dysphagia, inadequate treatment response, continued or increased pain, the need for additional procedures, failure to diagnose a condition, and creating a complication requiring transfusion or operation or additional intervention were discussed with the patient. The patient concurred with the proposed plan, giving informed consent.    UNIVERSAL PROTOCOL/ TIMEOUT: Preprocedure verification is complete- patient verified and consents confirmed.  H&P REVIEW: The patient's history and physical were reviewed today prior to procedure. All medications were reviewed and updated as well.  ANESTHESIA: lidocaine  1% without epi  PROCEDURE DETAILS: The patient was brought to the clinic and placed in a seated position.  The thyroid  cartilage and hyoid bone were palpated and the thyrohyoid membrane was identified. 1ml of kenalog  40mg /ml and 1ml of local anesthetic were mixed and injected at the site of the left and right superior laryngeal nerve entry through the thyrohyoid membrane, taking great care to  avoid intra-arterial injection. This concluded the procedure. There were no complications and the patient tolerated the procedure well.  ESTIMATED BLOOD LOSS: Minimal  FINDINGS:  No evidence of significant bleeding or other complication  CONDITION: Stable  COMPLICATIONS: no significant complications  DISPOSITION: The patient tolerated the procedure well without apparent complications and was ambulatory.  NOTES: tolerated well

## 2024-04-05 ENCOUNTER — Other Ambulatory Visit: Payer: Self-pay | Admitting: Allergy

## 2024-04-05 DIAGNOSIS — J3089 Other allergic rhinitis: Secondary | ICD-10-CM

## 2024-04-12 ENCOUNTER — Ambulatory Visit (INDEPENDENT_AMBULATORY_CARE_PROVIDER_SITE_OTHER): Admitting: Otolaryngology

## 2024-05-30 DIAGNOSIS — R051 Acute cough: Secondary | ICD-10-CM | POA: Diagnosis not present

## 2024-06-06 ENCOUNTER — Inpatient Hospital Stay: Attending: Hematology and Oncology | Admitting: Hematology and Oncology

## 2024-06-06 VITALS — BP 116/73 | HR 89 | Temp 98.2°F | Resp 18 | Ht 65.5 in | Wt 150.1 lb

## 2024-06-06 DIAGNOSIS — N6022 Fibroadenosis of left breast: Secondary | ICD-10-CM | POA: Diagnosis present

## 2024-06-06 DIAGNOSIS — N6082 Other benign mammary dysplasias of left breast: Secondary | ICD-10-CM | POA: Diagnosis present

## 2024-06-06 DIAGNOSIS — Z9189 Other specified personal risk factors, not elsewhere classified: Secondary | ICD-10-CM | POA: Diagnosis not present

## 2024-06-06 NOTE — Progress Notes (Signed)
 [facility] CONSULT NOTE  Patient Care Team: Jason Leita Repine, FNP (Inactive) as PCP - General (Internal Medicine)  CHIEF COMPLAINTS/PURPOSE OF CONSULTATION:  At high risk for breast cancer  HISTORY OF PRESENTING ILLNESS:   Cynthia Wong 38 y.o. female is here because of recent diagnosis of high risk of  breast cancer. She was seen by our genetics team Elenor Orleans and was found to have high risk of breast cancer based on Tyer Cusick model, her lifetime risk was about 37%. She was hence recommended to follow-up in the high-risk breast clinic. She had left breast lumpectomy which showed complex sclerosing lesion with adenosis, usual ductal hyperplasia and apocrine metaplasia.  No evidence of malignancy.  History of Present Illness  History of Present Illness  Cynthia Wong is a 38 year old female who presents for follow-up after recent pneumonia and breast health monitoring.  She is recovering from pneumonia diagnosed last week. Initially, she thought she was improving, but later it was confirmed. Her child was sick first with bronchitis and is now better. She feels better than last week but not fully recovered.  She had a breast MRI in July, which was normal. She has a history of breast biopsies, the most recent in June 2023, which was benign. She reports tenderness at the biopsy scar but no new lumps or changes.  She mentions weight loss due to recent illness but is eating more now. She has an IUD in place and is not pregnant. No new breast lumps or changes.  Rest of the pertinent 10 point ROS reviewed and neg.  MEDICAL HISTORY:  Past Medical History:  Diagnosis Date   Bronchitis    Family history of adverse reaction to anesthesia    mother with nausea   Family history of breast cancer 07/31/2021   Family history of pancreatic cancer 07/31/2021   Hemorrhoid    Vaginal Pap smear, abnormal    colposcopy    SURGICAL HISTORY: Past Surgical  History:  Procedure Laterality Date   BREAST LUMPECTOMY Left 08/28/2021   Procedure: LEFT BREAST LUMPECTOMY;  Surgeon: Curvin Deward MOULD, MD;  Location: [facility];  Service: General;  Laterality: Left;   NO PAST SURGERIES      SOCIAL HISTORY: Social History   Socioeconomic History   Marital status: Single    Spouse name: Not on file   Number of children: Not on file   Years of education: Not on file   Highest education level: Associate degree: occupational, scientist, product/process development, or vocational program  Occupational History   Not on file  Tobacco Use   Smoking status: Never   Smokeless tobacco: Never  Vaping Use   Vaping status: Never Used  Substance and Sexual Activity   Alcohol use: Yes    Comment: socially   Drug use: No   Sexual activity: Yes    Partners: Male    Birth control/protection: I.U.D.  Other Topics Concern   Not on file  Social History Narrative   Not on file   Social Drivers of Health   Financial Resource Strain: Low Risk  (06/13/2023)   Overall Financial Resource Strain (CARDIA)    Difficulty of Paying Living Expenses: Not hard at all  Food Insecurity: No Food Insecurity (06/13/2023)   Hunger Vital Sign    Worried About Running Out of Food in the Last Year: Never true    Ran Out of Food in the Last Year: Never true  Transportation Needs: No  Transportation Needs (06/13/2023)   PRAPARE - Administrator, Civil Service (Medical): No    Lack of Transportation (Non-Medical): No  Physical Activity: Unknown (06/13/2023)   Exercise Vital Sign    Days of Exercise per Week: 0 days    Minutes of Exercise per Session: Not on file  Stress: No Stress Concern Present (06/13/2023)   [employer] of Occupational Health - Occupational Stress Questionnaire    Feeling of Stress : Not at all  Social Connections: Moderately Integrated (06/13/2023)   Social Connection and Isolation Panel    Frequency of Communication with Friends and Family: More than three  times a week    Frequency of Social Gatherings with Friends and Family: Once a week    Attends Religious Services: 1 to 4 times per year    Active Member of [employer] or Organizations: No    Attends Engineer, Structural: Not on file    Marital Status: Living with partner  Intimate Partner Violence: Unknown (10/07/2021)   Received from [facility]   HITS    Physically Hurt: Not on file    Insult or Talk Down To: Not on file    Threaten Physical Harm: Not on file    Scream or Curse: Not on file    FAMILY HISTORY: Family History  Problem Relation Age of Onset   Arthritis Mother    Hypertension Mother    Diabetes Mother    Lung cancer Father        d. late 54s   Depression Sister    Asthma Brother    Asthma Son    Breast cancer Maternal Aunt        dx late 85s   Breast cancer Maternal Aunt        dx mid 70s   Breast cancer Paternal Aunt        dx 44s   Breast cancer Maternal Grandmother        dx 76s   Pancreatic cancer Maternal Grandmother        dx 52s   Cancer Paternal Grandmother        unknown type; dx 50s-60s   Cervical cancer Cousin        maternal cousin    ALLERGIES:  has no known allergies.  MEDICATIONS:  Current Outpatient Medications  Medication Sig Dispense Refill   albuterol  (VENTOLIN  HFA) 108 (90 Base) MCG/ACT inhaler Inhale 2 puffs into the lungs every 6 (six) hours as needed for wheezing or shortness of breath. 8 g 0   budesonide -formoterol  (SYMBICORT ) 80-4.5 MCG/ACT inhaler Inhale 2 puffs into the lungs in the morning and at bedtime. with spacer and rinse mouth afterwards. 1 each 3   famotidine  (PEPCID ) 20 MG tablet One after supper 30 tablet 11   gabapentin  (NEURONTIN ) 100 MG capsule Take 1 capsule (100 mg total) by mouth 4 (four) times daily. 120 capsule 2   ipratropium (ATROVENT ) 0.03 % nasal spray PLACE 1-2 SPRAYS INTO BOTH NOSTRILS 2 (TWO) TIMES DAILY AS NEEDED (NASAL DRAINAGE). 30 mL 6   ketoconazole  (NIZORAL ) 2 % shampoo Apply 1  Application topically 2 (two) times a week. 120 mL 0   levonorgestrel  (LILETTA , 52 MG,) 20.1 MCG/DAY IUD 1 each by Intrauterine route once.     pantoprazole  (PROTONIX ) 40 MG tablet TAKE 1 TABLET BY MOUTH DAILY 30-60 MIN BEFORE FIRST MEAL OF THE DAY 90 tablet 0   No current facility-administered medications for this visit.    PHYSICAL EXAMINATION: ECOG  PERFORMANCE STATUS: 0 - Asymptomatic  There were no vitals filed for this visit.   There were no vitals filed for this visit.  Physical Exam Constitutional:      Appearance: Normal appearance.  Chest:       Comments: Bilateral breasts inspected. No palpable masses. No regional adenopathy except for the changes as mentioned above. At the area of marking, there is still palpable abnormality measuring a few mm which has been previously biopsied. Musculoskeletal:        General: No swelling or tenderness. Normal range of motion.     Cervical back: Normal range of motion and neck supple. No rigidity.  Lymphadenopathy:     Cervical: No cervical adenopathy.  Neurological:     General: No focal deficit present.     Mental Status: She is alert.  Psychiatric:        Mood and Affect: Mood normal.      LABORATORY DATA:  I have reviewed the data as listed Lab Results  Component Value Date   WBC 5.6 10/28/2021   HGB 13.0 10/28/2021   HCT 40.7 10/28/2021   MCV 88.8 10/28/2021   PLT 271.0 10/28/2021   Lab Results  Component Value Date   NA 138 04/29/2021   K 4.5 04/29/2021   CL 103 04/29/2021   CO2 21 04/29/2021    RADIOGRAPHIC STUDIES: I have personally reviewed the radiological reports and agreed with the findings in the report.  ASSESSMENT AND PLAN:   This is a very pleasant 38 year old premenopausal woman who was referred to high risk breast clinic given increased lifetime risk of breast cancer and given abnormal mammogram. During her initial visit we have discussed her lifetime risk of breast cancer which comes to almost  37% per TC model however she does not qualify for tamoxifen prevention given 5-year risk of breast cancer being only 0.4%.    Assessment and Plan Assessment & Plan  At high risk for breast cancer. No concerns on exam. Post biopsy changes noted in right breast upper outer quadrant No other palpable masses. No regional adenopathy Continue mammogram alternating with MRI. Recent MRI with no evidence of malignancy Mammogram ordered for Jan. MRI to be done in July, ordered. Ensure breast exam in 6 months with PCP or gyn. Report any new breast changes and I will see her back in 1 yr  Time spent: 20 min   Amber Stalls, MD 06/06/24

## 2024-07-16 ENCOUNTER — Ambulatory Visit

## 2024-07-27 ENCOUNTER — Ambulatory Visit
Admission: RE | Admit: 2024-07-27 | Discharge: 2024-07-27 | Disposition: A | Source: Ambulatory Visit | Attending: Hematology and Oncology

## 2024-07-27 DIAGNOSIS — Z9189 Other specified personal risk factors, not elsewhere classified: Secondary | ICD-10-CM

## 2025-01-28 ENCOUNTER — Other Ambulatory Visit

## 2025-06-07 ENCOUNTER — Inpatient Hospital Stay: Admitting: Hematology and Oncology
